# Patient Record
Sex: Female | Born: 1985
Health system: Southern US, Academic
[De-identification: ages and names within clinical notes are randomized; demographics above are authoritative.]

## PROBLEM LIST (undated history)

## (undated) ENCOUNTER — Telehealth

## (undated) ENCOUNTER — Encounter

## (undated) DIAGNOSIS — E669 Obesity, unspecified: Secondary | ICD-10-CM

## (undated) DIAGNOSIS — M1990 Unspecified osteoarthritis, unspecified site: Secondary | ICD-10-CM

## (undated) DIAGNOSIS — G478 Other sleep disorders: Secondary | ICD-10-CM

## (undated) DIAGNOSIS — G43109 Migraine with aura, not intractable, without status migrainosus: Secondary | ICD-10-CM

## (undated) DIAGNOSIS — D649 Anemia, unspecified: Secondary | ICD-10-CM

## (undated) DIAGNOSIS — G43909 Migraine, unspecified, not intractable, without status migrainosus: Secondary | ICD-10-CM

## (undated) HISTORY — DX: Unspecified osteoarthritis, unspecified site: M19.90

## (undated) HISTORY — DX: Migraine, unspecified, not intractable, without status migrainosus: G43.909

## (undated) HISTORY — DX: Anemia, unspecified: D64.9

## (undated) HISTORY — PX: OTHER SURGICAL HISTORY: SHX169

## (undated) HISTORY — DX: Migraine with aura, not intractable, without status migrainosus: G43.109

## (undated) HISTORY — DX: Obesity, unspecified: E66.9

---

## 2014-07-08 ENCOUNTER — Emergency Department
Admission: EM | Admit: 2014-07-08 | Discharge: 2014-07-08 | Disposition: A | Discharge status: Home / Self Care | Payer: Medicaid Other | Attending: Emergency Medicine | Admitting: Emergency Medicine

## 2014-07-08 ENCOUNTER — Encounter: Payer: Self-pay | Admitting: Emergency Medicine

## 2014-07-08 DIAGNOSIS — Z87891 Personal history of nicotine dependence: Secondary | ICD-10-CM | POA: Insufficient documentation

## 2014-07-08 DIAGNOSIS — Z3202 Encounter for pregnancy test, result negative: Secondary | ICD-10-CM | POA: Insufficient documentation

## 2014-07-08 DIAGNOSIS — N390 Urinary tract infection, site not specified: Secondary | ICD-10-CM | POA: Insufficient documentation

## 2014-07-08 DIAGNOSIS — R350 Frequency of micturition: Secondary | ICD-10-CM | POA: Diagnosis present

## 2014-07-08 LAB — URINALYSIS COMPLETE WITH MICROSCOPIC (ARMC ONLY)
Bilirubin Urine: NEGATIVE
Glucose, UA: NEGATIVE mg/dL
Hgb urine dipstick: NEGATIVE
Ketones, ur: NEGATIVE mg/dL
LEUKOCYTES UA: NEGATIVE
NITRITE: NEGATIVE
PH: 5 (ref 5.0–8.0)
Protein, ur: NEGATIVE mg/dL
Specific Gravity, Urine: 1.025 (ref 1.005–1.030)

## 2014-07-08 LAB — POCT PREGNANCY, URINE: PREG TEST UR: NEGATIVE

## 2014-07-08 MED ORDER — SULFAMETHOXAZOLE-TRIMETHOPRIM 800-160 MG PO TABS: 1.0000 | ORAL_TABLET | Freq: Two times a day (BID) | ORAL | Status: DC

## 2014-07-08 MED ORDER — IBUPROFEN 800 MG PO TABS: 800.0000 mg | ORAL_TABLET | Freq: Three times a day (TID) | ORAL | Status: DC | PRN

## 2014-07-08 MED ORDER — PHENAZOPYRIDINE HCL 200 MG PO TABS: 200.0000 mg | ORAL_TABLET | Freq: Three times a day (TID) | ORAL | Status: DC | PRN

## 2014-07-08 NOTE — Discharge Instructions (Signed)

## 2014-07-08 NOTE — ED Notes (Signed)
C/o urinary frequency and burning upon urination, also having some lower back pain

## 2014-07-08 NOTE — ED Notes (Signed)
Pt has left lower back pain.  Pt has urinary frequency.  Pt also reports vag discharge.  No vag bleeding.  No abd pain.  No n/v/d.  Alert.

## 2014-07-08 NOTE — ED Notes (Signed)
AAOx3.  Skin warm and dry.  Verbalizing good understanding of discharge instructions.

## 2014-07-08 NOTE — ED Provider Notes (Signed)
[facility] Emergency Department Provider Note  ____________________________________________  Time seen: Approximately 3:54 PM  I have reviewed the triage vital signs and the nursing notes.   HISTORY  Chief Complaint Urinary Frequency    HPI Nicola Policemanda Breeland is a 29 y.o. female who presents with complaints of having a UTI and lower back pain with urinary frequency. Patient denies any vaginal bleeding or abdominal pain, no nausea vomiting or diarrhea.   No past medical history on file.  There are no active problems to display for this patient.   No past surgical history on file.  Current Outpatient Rx  Name  Route  Sig  Dispense  Refill  . ibuprofen (ADVIL,MOTRIN) 800 MG tablet   Oral   Take 1 tablet (800 mg total) by mouth every 8 (eight) hours as needed.   30 tablet   0   . phenazopyridine (PYRIDIUM) 200 MG tablet   Oral   Take 1 tablet (200 mg total) by mouth 3 (three) times daily as needed for pain.   6 tablet   0   . sulfamethoxazole-trimethoprim (BACTRIM DS,SEPTRA DS) 800-160 MG per tablet   Oral   Take 1 tablet by mouth 2 (two) times daily.   20 tablet   0     Allergies Naproxen and Prednisone  No family history on file.  Social History History  Substance Use Topics  . Smoking status: Former Games developermoker  . Smokeless tobacco: Not on file  . Alcohol Use: No    Review of Systems Constitutional: No fever/chills Eyes: No visual changes. ENT: No sore throat. Cardiovascular: Denies chest pain. Respiratory: Denies shortness of breath. Gastrointestinal: No abdominal pain.  No nausea, no vomiting.  No diarrhea.  No constipation. Genitourinary: Positive for dysuria Musculoskeletal: Mild low back pain. Skin: Negative for rash. Neurological: Negative for headaches, focal weakness or numbness.  10-point ROS otherwise negative.  ____________________________________________   PHYSICAL EXAM:  VITAL SIGNS: ED Triage Vitals  Enc  Vitals Group     BP 07/08/14 1528 115/91 mmHg     Pulse Rate 07/08/14 1528 94     Resp 07/08/14 1528 18     Temp 07/08/14 1528 98.2 F (36.8 C)     Temp Source 07/08/14 1528 Oral     SpO2 07/08/14 1528 98 %     Weight 07/08/14 1528 260 lb (117.935 kg)     Height 07/08/14 1528 5\' 11"  (1.803 m)     Head Cir --      Peak Flow --      Pain Score 07/08/14 1529 7     Pain Loc --      Pain Edu? --      Excl. in GC? --     Constitutional: Alert and oriented. Well appearing and in no acute distress. Eyes: Conjunctivae are normal. PERRL. EOMI. Head: Atraumatic. Nose: No congestion/rhinnorhea. Mouth/Throat: Mucous membranes are moist.  Oropharynx non-erythematous. Neck: No stridor.   Cardiovascular: Normal rate, regular rhythm. Grossly normal heart sounds.  Good peripheral circulation. Respiratory: Normal respiratory effort.  No retractions. Lungs CTAB. Gastrointestinal: Soft and nontender. No distention. No abdominal bruits. No CVA tenderness. Musculoskeletal: No lower extremity tenderness nor edema.  No joint effusions. Neurologic:  Normal speech and language. No gross focal neurologic deficits are appreciated. Speech is normal. No gait instability. Skin:  Skin is warm, dry and intact. No rash noted. Psychiatric: Mood and affect are normal. Speech and behavior are normal.  ____________________________________________   LABS (all labs ordered  are listed, but only abnormal results are displayed)  Labs Reviewed  URINALYSIS COMPLETEWITH MICROSCOPIC ([facility] ONLY) - Abnormal; Notable for the following:    Color, Urine YELLOW (*)    APPearance CLEAR (*)    Bacteria, UA RARE (*)    Squamous Epithelial / LPF 0-5 (*)    All other components within normal limits  POCT PREGNANCY, URINE   ____________________________________________  EKG  Not  applicable ____________________________________________  RADIOLOGY  Deferred ____________________________________________   PROCEDURES  Procedure(s) performed: None  Critical Care performed: No  ____________________________________________   INITIAL IMPRESSION / ASSESSMENT AND PLAN / ED COURSE  Pertinent labs & imaging results that were available during my care of the patient were reviewed by me and considered in my medical decision making (see chart for details).  Acute UTI. Rx given for Bactrim DS twice a day and Pyridium. Ibuprofen given for low back pain. Patient voices no other emergency medical complaints at this time. Will return to the ER with worsening symptomology or follow up with her PCP. ____________________________________________   FINAL CLINICAL IMPRESSION(S) / ED DIAGNOSES  Final diagnoses:  UTI (lower urinary tract infection)      Evangeline Dakin, PA-C 07/08/14 1610  Maurilio Lovely, MD 07/09/14 0030

## 2014-08-26 ENCOUNTER — Ambulatory Visit: Payer: Medicaid Other | Admitting: Family Medicine

## 2014-11-04 ENCOUNTER — Encounter: Payer: Self-pay | Admitting: Family Medicine

## 2014-11-04 ENCOUNTER — Encounter (INDEPENDENT_AMBULATORY_CARE_PROVIDER_SITE_OTHER): Payer: Self-pay

## 2014-11-04 ENCOUNTER — Ambulatory Visit (INDEPENDENT_AMBULATORY_CARE_PROVIDER_SITE_OTHER): Payer: Medicaid Other | Admitting: Family Medicine

## 2014-11-04 VITALS — BP 120/76 | HR 88 | Temp 97.8°F | Resp 16 | Wt 259.5 lb

## 2014-11-04 DIAGNOSIS — Z136 Encounter for screening for cardiovascular disorders: Secondary | ICD-10-CM | POA: Diagnosis not present

## 2014-11-04 DIAGNOSIS — Z1231 Encounter for screening mammogram for malignant neoplasm of breast: Secondary | ICD-10-CM

## 2014-11-04 DIAGNOSIS — G43109 Migraine with aura, not intractable, without status migrainosus: Secondary | ICD-10-CM

## 2014-11-04 DIAGNOSIS — Z13220 Encounter for screening for lipoid disorders: Secondary | ICD-10-CM | POA: Diagnosis not present

## 2014-11-04 DIAGNOSIS — R0681 Apnea, not elsewhere classified: Secondary | ICD-10-CM | POA: Diagnosis not present

## 2014-11-04 DIAGNOSIS — E669 Obesity, unspecified: Secondary | ICD-10-CM | POA: Insufficient documentation

## 2014-11-04 DIAGNOSIS — E66812 Obesity, class 2: Secondary | ICD-10-CM | POA: Insufficient documentation

## 2014-11-04 DIAGNOSIS — N644 Mastodynia: Secondary | ICD-10-CM | POA: Diagnosis not present

## 2014-11-04 DIAGNOSIS — IMO0001 Reserved for inherently not codable concepts without codable children: Secondary | ICD-10-CM

## 2014-11-04 DIAGNOSIS — D509 Iron deficiency anemia, unspecified: Secondary | ICD-10-CM | POA: Diagnosis not present

## 2014-11-04 DIAGNOSIS — R590 Localized enlarged lymph nodes: Secondary | ICD-10-CM | POA: Diagnosis not present

## 2014-11-04 DIAGNOSIS — R5383 Other fatigue: Secondary | ICD-10-CM

## 2014-11-04 HISTORY — DX: Migraine with aura, not intractable, without status migrainosus: G43.109

## 2014-11-04 NOTE — Progress Notes (Signed)
Name: Allison Lucas   MRN: [id number]    DOB: [birth year]   Date:11/04/2014       Progress Note  Subjective  Chief Complaint  Chief Complaint  Patient presents with  . Establish Care  . Anemia  . Migraine  . Referral    sleepy study to r/o nocturnal seizures, was told by Kizzie Bane pcp she might be having some seizures.    HPI  Allison Lucas is a 29 y.o. female here today to transition care of medical needs to a primary care provider. She reports having left [place] with several medical needs not yet worked up or in the process of being worked up. She does not have any records with her today. Symptoms reported today are ongoing issues with sleep. Waking up after nearly choking episodes witnessed by her daughter. She feels fatigued and groggy after each episodes. Daughter has also noticed one episode of eyes rolling to back of head and possible seizure like activity without loss of bowel or bladder. Does not occur every night but most nights. Denies previous history of seizure disorder but does suffer from migraine headaches. Otherwise also has anemia, source unknown but she denies excessive menstrual bleeding or dark tarry stools. She has not been on her iron supplements in months and reports fatigue but no chest pain or dizziness. While in [place] she had breast skin lesion left and right along with axillary masses which has somewhat resolved but she still has some axillary pain. Denies nipple discharge or fevers or redness to the skin of her breasts currently.     Past Medical History  Diagnosis Date  . Anemia   . Osteoarthritis     Patient Active Problem List   Diagnosis Date Noted  . Iron deficiency anemia 11/04/2014  . Witnessed apneic spells 11/04/2014  . Breast tenderness in female 11/04/2014  . Axillary lymphadenopathy 11/04/2014  . Encounter for screening mammogram for malignant neoplasm of breast 11/04/2014  . Obesity, Class II, BMI 35-39.9 11/04/2014  . Migraine without  aura and without status migrainosus, not intractable 11/04/2014  . Other fatigue 11/04/2014    Social History  Substance Use Topics  . Smoking status: Former Games developer  . Smokeless tobacco: Not on file  . Alcohol Use: No     Current outpatient prescriptions:  .  ibuprofen (ADVIL,MOTRIN) 800 MG tablet, Take 1 tablet (800 mg total) by mouth every 8 (eight) hours as needed., Disp: 30 tablet, Rfl: 0 .  nystatin (MYCOSTATIN/NYSTOP) 100000 UNIT/GM POWD, Apply topically., Disp: , Rfl:  .  nystatin cream (MYCOSTATIN), Apply 1 application topically 2 (two) times daily., Disp: , Rfl:  .  Prenatal Vit-Fe Fumarate-FA (PREPLUS) 27-1 MG TABS, Take by mouth., Disp: , Rfl:  .  rizatriptan (MAXALT) 10 MG tablet, Take 10 mg by mouth as needed for migraine. May repeat in 2 hours if needed, Disp: , Rfl:   History reviewed. No pertinent past surgical history.  Family History  Problem Relation Age of Onset  . Arthritis Mother   . Diabetes Mother   . Hyperlipidemia Mother   . Hypertension Mother   . Asthma Daughter     Allergies  Allergen Reactions  . Naproxen Rash  . Prednisone Rash     Review of Systems  CONSTITUTIONAL: No significant weight changes, fever, chills, weakness or fatigue.  HEENT:  - Eyes: No visual changes.  - Ears: No auditory changes. No pain.  - Nose: No sneezing, congestion, runny nose. - Throat: No sore throat. No changes  in swallowing. SKIN: No rash or itching.  CARDIOVASCULAR: No chest pain, chest pressure or chest discomfort. No palpitations or edema.  RESPIRATORY: No shortness of breath, cough or sputum.  GASTROINTESTINAL: No anorexia, nausea, vomiting. No changes in bowel habits. No abdominal pain or blood.  GENITOURINARY: No dysuria. No frequency. No discharge. NEUROLOGICAL: No headache, dizziness, syncope, paralysis, ataxia, numbness or tingling in the extremities. No memory changes. No change in bowel or bladder control.  MUSCULOSKELETAL: No joint pain. No muscle  pain. HEMATOLOGIC: Yes anemia. No excessive bleeding or bruising.  LYMPHATICS: Yes enlarged lymph nodes.  PSYCHIATRIC: No change in mood. Yes change in sleep pattern.  ENDOCRINOLOGIC: No reports of sweating, cold or heat intolerance. No polyuria or polydipsia.     Objective  BP 120/76 mmHg  Pulse 88  Temp(Src) 97.8 F (36.6 C) (Oral)  Resp 16  Wt 259 lb 8 oz (117.708 kg)  SpO2 97%  LMP 10/05/2014 Body mass index is 36.21 kg/(m^2).  Physical Exam  Constitutional: Patient is obese and well-nourished. In no distress. Pale. HEENT:  - Head: Normocephalic and atraumatic.  - Ears: Bilateral TMs gray, no erythema or effusion - Nose: Nasal mucosa moist - Mouth/Throat: Oropharynx is clear and moist. No tonsillar hypertrophy or erythema. No post nasal drainage.  - Eyes: Conjunctivae clear, EOM movements normal. PERRLA. No scleral icterus.  Neck: Normal range of motion. Neck supple. No JVD present. No thyromegaly present.  Cardiovascular: Normal rate, regular rhythm and normal heart sounds.  No murmur heard.  Pulmonary/Chest: Effort normal and breath sounds normal. No respiratory distress. Breast: Bilaterally large pendulous breast tissue with no over masses, skin tugging, skin dimpling, erythema. Some axillary lymphadenopathy noted left greater than right. No nipple discharge.  Musculoskeletal: Normal range of motion bilateral UE and LE, no joint effusions. Peripheral vascular: Bilateral LE no edema. Neurological: CN II-XII grossly intact with no focal deficits. Alert and oriented to person, place, and time. Coordination, balance, strength, speech and gait are normal.  Skin: Skin is warm and dry. No rash noted. No erythema.  Psychiatric: Patient has a normal mood and affect. Behavior is normal in office today. Judgment and thought content normal in office today.   Assessment & Plan  1. Iron deficiency anemia Will reassess with blood work and get her back on oral supplement. If  hemoglobin low or iron saturation low will need Heme evaluation.  - CBC with Differential/Platelet - Comprehensive metabolic panel - Ferritin - Iron - Iron and TIBC  2. Witnessed apneic spells Etiologies include sleep apnea vs night seizures. Will get sleep study first.   - Ambulatory referral to Sleep Studies  3. Breast tenderness in female Clinical exam not very impressive for breast tissue abnormality other than axillary lymphadenopathy but I will have her undergo some breast imaging.   - CBC with Differential/Platelet - MM Digital Screening; Future - MM Digital Diagnostic Bilat; Future - US BREAST LTD UNI LEFT INC AXILLA; Future - US BREAST LTD UNI RIGHT INC AXILLA; Future  4. Axillary lymphadenopathy  - CBC with Differential/Platelet - MM Digital Screening; Future - MM Digital Diagnostic Bilat; Future - US BREAST LTD UNI LEFT INC AXILLA; Future - US BREAST LTD UNI RIGHT INC AXILLA; Future  5. Encounter for screening mammogram for malignant neoplasm of breast  - MM Digital Screening; Future - MM Digital Diagnostic Bilat; Future - US BREAST LTD UNI LEFT INC AXILLA; Future - US BREAST LTD UNI RIGHT INC AXILLA; Future  6. Other fatigue Likely combination of  poor sleep and anemia.  - Comprehensive metabolic panel - TSH  7. Encounter for cholesteral screening for cardiovascular disease  - Lipid panel

## 2014-11-05 ENCOUNTER — Other Ambulatory Visit: Payer: Self-pay | Admitting: Family Medicine

## 2014-11-05 ENCOUNTER — Telehealth: Payer: Self-pay | Admitting: Family Medicine

## 2014-11-05 DIAGNOSIS — R739 Hyperglycemia, unspecified: Secondary | ICD-10-CM

## 2014-11-05 DIAGNOSIS — D509 Iron deficiency anemia, unspecified: Secondary | ICD-10-CM

## 2014-11-05 LAB — COMPREHENSIVE METABOLIC PANEL
ALT: 8 IU/L (ref 0–32)
AST: 11 IU/L (ref 0–40)
Albumin/Globulin Ratio: 1.2 (ref 1.1–2.5)
Albumin: 4.4 g/dL (ref 3.5–5.5)
Alkaline Phosphatase: 98 IU/L (ref 39–117)
BUN/Creatinine Ratio: 14 (ref 8–20)
BUN: 10 mg/dL (ref 6–20)
Bilirubin Total: <0.2 mg/dL (ref 0.0–1.2)
CALCIUM: 9.5 mg/dL (ref 8.7–10.2)
CO2: 25 mmol/L (ref 18–29)
CREATININE: 0.74 mg/dL (ref 0.57–1.00)
Chloride: 101 mmol/L (ref 97–108)
GFR calc Af Amer: 126 mL/min/{1.73_m2} (ref >59)
GFR, EST NON AFRICAN AMERICAN: 109 mL/min/{1.73_m2} (ref >59)
GLOBULIN, TOTAL: 3.7 g/dL (ref 1.5–4.5)
Glucose: 125 mg/dL — ABNORMAL HIGH (ref 65–99)
Potassium: 4.2 mmol/L (ref 3.5–5.2)
SODIUM: 142 mmol/L (ref 134–144)
Total Protein: 8.1 g/dL (ref 6.0–8.5)

## 2014-11-05 LAB — IRON AND TIBC
Iron Saturation: 6 % — CL (ref 15–55)
Iron: 25 ug/dL — ABNORMAL LOW (ref 27–159)
Total Iron Binding Capacity: 438 ug/dL (ref 250–450)
UIBC: 413 ug/dL (ref 131–425)

## 2014-11-05 LAB — CBC WITH DIFFERENTIAL/PLATELET
Basophils Absolute: 0 10*3/uL (ref 0.0–0.2)
Basos: 0 %
EOS (ABSOLUTE): 0 10*3/uL (ref 0.0–0.4)
EOS: 0 %
HEMATOCRIT: 33.6 % — AB (ref 34.0–46.6)
Hemoglobin: 10.5 g/dL — ABNORMAL LOW (ref 11.1–15.9)
IMMATURE GRANS (ABS): 0 10*3/uL (ref 0.0–0.1)
IMMATURE GRANULOCYTES: 0 %
LYMPHS: 27 %
Lymphocytes Absolute: 2.3 10*3/uL (ref 0.7–3.1)
MCH: 19.6 pg — ABNORMAL LOW (ref 26.6–33.0)
MCHC: 31.3 g/dL — ABNORMAL LOW (ref 31.5–35.7)
MCV: 63 fL — ABNORMAL LOW (ref 79–97)
MONOCYTES: 6 %
MONOS ABS: 0.5 10*3/uL (ref 0.1–0.9)
NEUTROS PCT: 67 %
Neutrophils Absolute: 5.6 10*3/uL (ref 1.4–7.0)
Platelets: 348 10*3/uL (ref 150–379)
RBC: 5.37 x10E6/uL — AB (ref 3.77–5.28)
RDW: 18.7 % — ABNORMAL HIGH (ref 12.3–15.4)
WBC: 8.5 10*3/uL (ref 3.4–10.8)

## 2014-11-05 LAB — LIPID PANEL
CHOL/HDL RATIO: 3.1 ratio (ref 0.0–4.4)
Cholesterol, Total: 197 mg/dL (ref 100–199)
HDL: 64 mg/dL (ref >39)
LDL CALC: 119 mg/dL — AB (ref 0–99)
Triglycerides: 68 mg/dL (ref 0–149)
VLDL Cholesterol Cal: 14 mg/dL (ref 5–40)

## 2014-11-05 LAB — TSH: TSH: 2.83 u[IU]/mL (ref 0.450–4.500)

## 2014-11-05 LAB — FERRITIN: Ferritin: 15 ng/mL (ref 15–150)

## 2014-11-05 MED ORDER — FERROUS SULFATE 325 (65 FE) MG PO TABS: 325.0000 mg | ORAL_TABLET | Freq: Two times a day (BID) | ORAL | Status: DC

## 2014-11-05 NOTE — Telephone Encounter (Signed)
Contacted this patient to review Dr. Debby Freiberg message and to see if she wanted to proceed with the hematologist referral. Patient was informed and agreed to proceed with referral . Patient then asked if she was still going to call in the rx iron b/c the pharmacy did not have it yesterday. I told her that I would check.  Spoke to Griffin with [facility] @ 9:14am to add on test #[id number] (HgA1c). Fax will be sent to Dr. Sherley Bounds authorizing this add on.

## 2014-11-05 NOTE — Telephone Encounter (Signed)
Sent iron Rx to [facility] and referral to heme placed.

## 2014-11-05 NOTE — Telephone Encounter (Signed)
Please let Allison Lucas know that I have reviewed some of her recent lab work and she is quite severely iron deficient anemia and I want her to consult with hematology specialist as she may need IV iron therapy to boost her levels up and oral iron will not be enough initially. Can I place referral? Sometimes low iron can cause leg cramping, shaking, this may explain some of her symptoms at night but I still want her to do the sleep study too which I have already ordered.  Allison Lucas: call [facility] and add on HBA1C as her glucose was high.

## 2014-11-06 ENCOUNTER — Encounter: Payer: Self-pay | Admitting: Family Medicine

## 2014-11-06 DIAGNOSIS — R739 Hyperglycemia, unspecified: Secondary | ICD-10-CM | POA: Insufficient documentation

## 2014-11-06 LAB — HGB A1C W/O EAG: Hgb A1c MFr Bld: 6.3 % — ABNORMAL HIGH (ref 4.8–5.6)

## 2014-11-06 LAB — SPECIMEN STATUS REPORT

## 2014-11-21 ENCOUNTER — Inpatient Hospital Stay: Payer: Medicaid Other | Attending: Oncology | Admitting: Oncology

## 2014-11-21 VITALS — BP 119/78 | HR 75 | Temp 96.4°F | Resp 20 | Wt 262.8 lb

## 2014-11-21 DIAGNOSIS — D509 Iron deficiency anemia, unspecified: Secondary | ICD-10-CM | POA: Diagnosis present

## 2014-11-21 DIAGNOSIS — R531 Weakness: Secondary | ICD-10-CM | POA: Diagnosis not present

## 2014-11-21 DIAGNOSIS — R5383 Other fatigue: Secondary | ICD-10-CM | POA: Insufficient documentation

## 2014-11-21 DIAGNOSIS — M1990 Unspecified osteoarthritis, unspecified site: Secondary | ICD-10-CM | POA: Diagnosis not present

## 2014-11-21 DIAGNOSIS — Z87891 Personal history of nicotine dependence: Secondary | ICD-10-CM | POA: Insufficient documentation

## 2014-11-21 DIAGNOSIS — Z79899 Other long term (current) drug therapy: Secondary | ICD-10-CM | POA: Diagnosis not present

## 2014-11-21 NOTE — Progress Notes (Signed)
Patient here today for initial evaluation regarding anemia. Patient reports weakness and fatigue, currently taking oral iron twice a day.

## 2014-11-28 ENCOUNTER — Ambulatory Visit: Payer: Medicaid Other

## 2014-12-05 ENCOUNTER — Ambulatory Visit: Payer: Medicaid Other

## 2014-12-05 NOTE — Progress Notes (Signed)
[facility]  Telephone:([phone] [phone]  ID: Allison Lucas OB: 04/02/1985  MR#: [id number]  AOZ#:[id number]  Patient Care Team: Edwena Felty, MD as PCP - General (Family Medicine)  CHIEF COMPLAINT:  Chief Complaint  Patient presents with  . Anemia    new evaluation    INTERVAL HISTORY: Patient is a 29 year old female who was found to have a decreased hemoglobin and iron stores and routine evaluation. She currently feels weak and fatigued, but otherwise feels well. She is no neurologic complaints. She denies any recent fevers or illnesses. She has a good appetite and denies weight loss. She has no chest pain or shortness of breath. She denies any nausea, vomiting, constipation, or diarrhea. She has no melena or hematochezia. She has no urinary complaints. Patient otherwise feels well and offers no further specific complaints.  REVIEW OF SYSTEMS:   Review of Systems  Constitutional: Positive for malaise/fatigue. Negative for fever and weight loss.  Respiratory: Negative.   Cardiovascular: Negative.   Gastrointestinal: Negative.  Negative for blood in stool and melena.  Musculoskeletal: Negative.   Neurological: Positive for weakness.    As per HPI. Otherwise, a complete review of systems is negatve.  PAST MEDICAL HISTORY: Past Medical History  Diagnosis Date  . Anemia   . Osteoarthritis     PAST SURGICAL HISTORY: No past surgical history on file.  FAMILY HISTORY Family History  Problem Relation Age of Onset  . Arthritis Mother   . Diabetes Mother   . Hyperlipidemia Mother   . Hypertension Mother   . Asthma Daughter        ADVANCED DIRECTIVES:    HEALTH MAINTENANCE: Social History  Substance Use Topics  . Smoking status: Former Games developer  . Smokeless tobacco: Not on file  . Alcohol Use: No     Colonoscopy:  PAP:  Bone density:  Lipid panel:  Allergies  Allergen Reactions  . Naproxen Rash  . Prednisone Rash     Current Outpatient Prescriptions  Medication Sig Dispense Refill  . ferrous sulfate 325 (65 FE) MG tablet Take 1 tablet (325 mg total) by mouth 2 (two) times daily with a meal. 60 tablet 2  . ibuprofen (ADVIL,MOTRIN) 800 MG tablet Take 1 tablet (800 mg total) by mouth every 8 (eight) hours as needed. 30 tablet 0  . nystatin (MYCOSTATIN/NYSTOP) 100000 UNIT/GM POWD Apply topically.    . nystatin cream (MYCOSTATIN) Apply 1 application topically 2 (two) times daily.    . rizatriptan (MAXALT) 10 MG tablet Take 10 mg by mouth as needed for migraine. May repeat in 2 hours if needed    . Prenatal Vit-Fe Fumarate-FA (PREPLUS) 27-1 MG TABS Take by mouth.     No current facility-administered medications for this visit.    OBJECTIVE: Filed Vitals:   11/21/14 1206  BP: 119/78  Pulse: 75  Temp: 96.4 F (35.8 C)  Resp: 20     Body mass index is 36.67 kg/(m^2).    ECOG FS:0 - Asymptomatic  General: Well-developed, well-nourished, no acute distress. Eyes: Pink conjunctiva, anicteric sclera. HEENT: Normocephalic, moist mucous membranes, clear oropharnyx. Lungs: Clear to auscultation bilaterally. Heart: Regular rate and rhythm. No rubs, murmurs, or gallops. Abdomen: Soft, nontender, nondistended. No organomegaly noted, normoactive bowel sounds. Musculoskeletal: No edema, cyanosis, or clubbing. Neuro: Alert, answering all questions appropriately. Cranial nerves grossly intact. Skin: No rashes or petechiae noted. Psych: Normal affect. Lymphatics: No cervical, calvicular, axillary or inguinal LAD.   LAB RESULTS:  Lab Results  Component  Value Date   NA 142 11/04/2014   K 4.2 11/04/2014   CL 101 11/04/2014   CO2 25 11/04/2014   GLUCOSE 125* 11/04/2014   BUN 10 11/04/2014   CREATININE 0.74 11/04/2014   CALCIUM 9.5 11/04/2014   PROT 8.1 11/04/2014   ALBUMIN 4.4 11/04/2014   AST 11 11/04/2014   ALT 8 11/04/2014   ALKPHOS 98 11/04/2014   BILITOT <0.2 11/04/2014   GFRNONAA 109  11/04/2014   GFRAA 126 11/04/2014    Lab Results  Component Value Date   WBC 8.5 11/04/2014   NEUTROABS 5.6 11/04/2014   HCT 33.6* 11/04/2014     STUDIES: No results found.  ASSESSMENT: Iron deficiency anemia.  PLAN:    1. Iron deficiency anemia: Patient's hemoglobin and iron stores are significantly reduced and she will benefit from IV iron. Return to clinic in 1 and 2 weeks for 510 mg IV Feraheme. Patient will then return to clinic in 3 months with repeat laboratory work and further evaluation. Will complete anemia workup including hemoglobinopathy profile with her next lab draw. She has been instructed to continue her oral iron supplementation.  Patient expressed understanding and was in agreement with this plan. She also understands that She can call clinic at any time with any questions, concerns, or complaints.    Jeralyn Ruthsimothy J Finnegan, MD   12/05/2014 3:09 PM

## 2015-03-03 ENCOUNTER — Inpatient Hospital Stay: Payer: Medicaid Other | Admitting: Oncology

## 2015-03-03 ENCOUNTER — Inpatient Hospital Stay: Payer: Medicaid Other

## 2015-03-10 ENCOUNTER — Inpatient Hospital Stay: Payer: Medicaid Other

## 2015-03-10 ENCOUNTER — Inpatient Hospital Stay: Payer: Medicaid Other | Admitting: Oncology

## 2015-03-12 ENCOUNTER — Other Ambulatory Visit: Payer: Self-pay | Admitting: Family Medicine

## 2015-03-25 ENCOUNTER — Ambulatory Visit: Payer: Medicaid Other | Admitting: Oncology

## 2015-03-25 ENCOUNTER — Other Ambulatory Visit: Payer: Medicaid Other

## 2015-03-25 ENCOUNTER — Ambulatory Visit: Payer: Medicaid Other

## 2015-07-22 ENCOUNTER — Encounter: Payer: Self-pay | Admitting: Family Medicine

## 2015-07-22 ENCOUNTER — Ambulatory Visit (INDEPENDENT_AMBULATORY_CARE_PROVIDER_SITE_OTHER): Payer: Medicaid Other | Admitting: Family Medicine

## 2015-07-22 ENCOUNTER — Emergency Department: Payer: Medicaid Other

## 2015-07-22 ENCOUNTER — Other Ambulatory Visit: Payer: Self-pay | Admitting: Family Medicine

## 2015-07-22 ENCOUNTER — Other Ambulatory Visit: Payer: Self-pay

## 2015-07-22 ENCOUNTER — Emergency Department
Admission: EM | Admit: 2015-07-22 | Discharge: 2015-07-22 | Disposition: A | Discharge status: Home / Self Care | Payer: Medicaid Other | Attending: Emergency Medicine | Admitting: Emergency Medicine

## 2015-07-22 ENCOUNTER — Emergency Department: Admission: EM | Admit: 2015-07-22 | Discharge status: Absent without leave | Payer: Medicaid Other

## 2015-07-22 VITALS — BP 120/92 | HR 102 | Temp 98.2°F | Resp 16 | Wt 269.0 lb

## 2015-07-22 DIAGNOSIS — D509 Iron deficiency anemia, unspecified: Secondary | ICD-10-CM

## 2015-07-22 DIAGNOSIS — Z114 Encounter for screening for human immunodeficiency virus [HIV]: Secondary | ICD-10-CM

## 2015-07-22 DIAGNOSIS — R002 Palpitations: Secondary | ICD-10-CM | POA: Diagnosis not present

## 2015-07-22 DIAGNOSIS — R195 Other fecal abnormalities: Secondary | ICD-10-CM | POA: Insufficient documentation

## 2015-07-22 DIAGNOSIS — R0789 Other chest pain: Secondary | ICD-10-CM | POA: Insufficient documentation

## 2015-07-22 DIAGNOSIS — R590 Localized enlarged lymph nodes: Secondary | ICD-10-CM | POA: Diagnosis not present

## 2015-07-22 DIAGNOSIS — Z87891 Personal history of nicotine dependence: Secondary | ICD-10-CM | POA: Insufficient documentation

## 2015-07-22 DIAGNOSIS — E669 Obesity, unspecified: Secondary | ICD-10-CM

## 2015-07-22 DIAGNOSIS — R9431 Abnormal electrocardiogram [ECG] [EKG]: Secondary | ICD-10-CM | POA: Insufficient documentation

## 2015-07-22 DIAGNOSIS — R12 Heartburn: Secondary | ICD-10-CM | POA: Insufficient documentation

## 2015-07-22 DIAGNOSIS — R739 Hyperglycemia, unspecified: Secondary | ICD-10-CM

## 2015-07-22 DIAGNOSIS — N644 Mastodynia: Secondary | ICD-10-CM | POA: Diagnosis not present

## 2015-07-22 DIAGNOSIS — R0781 Pleurodynia: Secondary | ICD-10-CM | POA: Diagnosis not present

## 2015-07-22 DIAGNOSIS — Z1231 Encounter for screening mammogram for malignant neoplasm of breast: Secondary | ICD-10-CM

## 2015-07-22 DIAGNOSIS — M1990 Unspecified osteoarthritis, unspecified site: Secondary | ICD-10-CM | POA: Insufficient documentation

## 2015-07-22 DIAGNOSIS — R000 Tachycardia, unspecified: Secondary | ICD-10-CM

## 2015-07-22 HISTORY — DX: Obesity, unspecified: E66.9

## 2015-07-22 LAB — CBC WITH DIFFERENTIAL/PLATELET
BASOS ABS: 0 {cells}/uL (ref 0–200)
BASOS PCT: 0 %
EOS PCT: 0 %
Eosinophils Absolute: 0 cells/uL — ABNORMAL LOW (ref 15–500)
HCT: 38 % (ref 35.0–45.0)
HEMOGLOBIN: 11.7 g/dL (ref 11.7–15.5)
LYMPHS ABS: 2366 {cells}/uL (ref 850–3900)
Lymphocytes Relative: 26 %
MCH: 20.9 pg — AB (ref 27.0–33.0)
MCHC: 30.8 g/dL — ABNORMAL LOW (ref 32.0–36.0)
MCV: 68 fL — ABNORMAL LOW (ref 80.0–100.0)
Monocytes Absolute: 455 cells/uL (ref 200–950)
Monocytes Relative: 5 %
NEUTROS ABS: 6279 {cells}/uL (ref 1500–7800)
Neutrophils Relative %: 69 %
Platelets: 352 10*3/uL (ref 140–400)
RBC: 5.59 MIL/uL — ABNORMAL HIGH (ref 3.80–5.10)
RDW: 18 % — ABNORMAL HIGH (ref 11.0–15.0)
WBC: 9.1 10*3/uL (ref 3.8–10.8)

## 2015-07-22 LAB — CBC
HCT: 37.2 % (ref 35.0–47.0)
Hemoglobin: 11.6 g/dL — ABNORMAL LOW (ref 12.0–16.0)
MCH: 21.2 pg — ABNORMAL LOW (ref 26.0–34.0)
MCHC: 31.3 g/dL — ABNORMAL LOW (ref 32.0–36.0)
MCV: 67.7 fL — ABNORMAL LOW (ref 80.0–100.0)
Platelets: 345 10*3/uL (ref 150–440)
RBC: 5.49 MIL/uL — ABNORMAL HIGH (ref 3.80–5.20)
RDW: 17.6 % — ABNORMAL HIGH (ref 11.5–14.5)
WBC: 11.8 10*3/uL — ABNORMAL HIGH (ref 3.6–11.0)

## 2015-07-22 LAB — BASIC METABOLIC PANEL WITH GFR
Anion gap: 8 (ref 5–15)
BUN: 12 mg/dL (ref 6–20)
CO2: 26 mmol/L (ref 22–32)
Calcium: 9.3 mg/dL (ref 8.9–10.3)
Chloride: 103 mmol/L (ref 101–111)
Creatinine, Ser: 0.9 mg/dL (ref 0.44–1.00)
GFR calc Af Amer: >60 mL/min
GFR calc non Af Amer: >60 mL/min
Glucose, Bld: 93 mg/dL (ref 65–99)
Potassium: 3.5 mmol/L (ref 3.5–5.1)
Sodium: 137 mmol/L (ref 135–145)

## 2015-07-22 LAB — FERRITIN: Ferritin: 25 ng/mL (ref 10–154)

## 2015-07-22 LAB — TSH: TSH: 1.39 mIU/L

## 2015-07-22 LAB — HEMOGLOBIN A1C
HEMOGLOBIN A1C: 6.2 % — AB (ref <5.7)
MEAN PLASMA GLUCOSE: 131 mg/dL

## 2015-07-22 LAB — TROPONIN I
Troponin I: 0.05 ng/mL — ABNORMAL HIGH
Troponin I: <0.03 ng/mL

## 2015-07-22 LAB — FIBRIN DERIVATIVES D-DIMER (ARMC ONLY): Fibrin derivatives D-dimer (ARMC): 363 (ref 0–499)

## 2015-07-22 MED ORDER — GI COCKTAIL ~~LOC~~
30.0000 mL | ORAL | Status: AC
  Administered 2015-07-22: 30 mL via ORAL
  Filled 2015-07-22: qty 30

## 2015-07-22 MED ORDER — FAMOTIDINE 20 MG PO TABS
40.0000 mg | ORAL_TABLET | Freq: Once | ORAL | Status: AC
  Administered 2015-07-22: 40 mg via ORAL
  Filled 2015-07-22: qty 2

## 2015-07-22 MED ORDER — FAMOTIDINE 20 MG PO TABS: 20.0000 mg | ORAL_TABLET | Freq: Two times a day (BID) | ORAL | Status: DC

## 2015-07-22 NOTE — Discharge Instructions (Signed)
Nonspecific Chest Pain  °Chest pain can be caused by many different conditions. There is always a chance that your pain could be related to something serious, such as a heart attack or a blood clot in your lungs. Chest pain can also be caused by conditions that are not life-threatening. If you have chest pain, it is very important to follow up with your health care provider. °CAUSES  °Chest pain can be caused by: °· Heartburn. °· Pneumonia or bronchitis. °· Anxiety or stress. °· Inflammation around your heart (pericarditis) or lung (pleuritis or pleurisy). °· A blood clot in your lung. °· A collapsed lung (pneumothorax). It can develop suddenly on its own (spontaneous pneumothorax) or from trauma to the chest. °· Shingles infection (varicella-zoster virus). °· Heart attack. °· Damage to the bones, muscles, and cartilage that make up your chest wall. This can include: °¨ Bruised bones due to injury. °¨ Strained muscles or cartilage due to frequent or repeated coughing or overwork. °¨ Fracture to one or more ribs. °¨ Sore cartilage due to inflammation (costochondritis). °RISK FACTORS  °Risk factors for chest pain may include: °· Activities that increase your risk for trauma or injury to your chest. °· Respiratory infections or conditions that cause frequent coughing. °· Medical conditions or overeating that can cause heartburn. °· Heart disease or family history of heart disease. °· Conditions or health behaviors that increase your risk of developing a blood clot. °· Having had chicken pox (varicella zoster). °SIGNS AND SYMPTOMS °Chest pain can feel like: °· Burning or tingling on the surface of your chest or deep in your chest. °· Crushing, pressure, aching, or squeezing pain. °· Dull or sharp pain that is worse when you move, cough, or take a deep breath. °· Pain that is also felt in your back, neck, shoulder, or arm, or pain that spreads to any of these areas. °Your chest pain may come and go, or it may stay  constant. °DIAGNOSIS °Lab tests or other studies may be needed to find the cause of your pain. Your health care provider may have you take a test called an ambulatory ECG (electrocardiogram). An ECG records your heartbeat patterns at the time the test is performed. You may also have other tests, such as: °· Transthoracic echocardiogram (TTE). During echocardiography, sound waves are used to create a picture of all of the heart structures and to look at how blood flows through your heart. °· Transesophageal echocardiogram (TEE). This is a more advanced imaging test that obtains images from inside your body. It allows your health care provider to see your heart in finer detail. °· Cardiac monitoring. This allows your health care provider to monitor your heart rate and rhythm in real time. °· Holter monitor. This is a portable device that records your heartbeat and can help to diagnose abnormal heartbeats. It allows your health care provider to track your heart activity for several days, if needed. °· Stress tests. These can be done through exercise or by taking medicine that makes your heart beat more quickly. °· Blood tests. °· Imaging tests. °TREATMENT  °Your treatment depends on what is causing your chest pain. Treatment may include: °· Medicines. These may include: °¨ Acid blockers for heartburn. °¨ Anti-inflammatory medicine. °¨ Pain medicine for inflammatory conditions. °¨ Antibiotic medicine, if an infection is present. °¨ Medicines to dissolve blood clots. °¨ Medicines to treat coronary artery disease. °· Supportive care for conditions that do not require medicines. This may include: °¨ Resting. °¨ Applying heat   or cold packs to injured areas. °¨ Limiting activities until pain decreases. °HOME CARE INSTRUCTIONS °· If you were prescribed an antibiotic medicine, finish it all even if you start to feel better. °· Avoid any activities that bring on chest pain. °· Do not use any tobacco products, including  cigarettes, chewing tobacco, or electronic cigarettes. If you need help quitting, ask your health care provider. °· Do not drink alcohol. °· Take medicines only as directed by your health care provider. °· Keep all follow-up visits as directed by your health care provider. This is important. This includes any further testing if your chest pain does not go away. °· If heartburn is the cause for your chest pain, you may be told to keep your head raised (elevated) while sleeping. This reduces the chance that acid will go from your stomach into your esophagus. °· Make lifestyle changes as directed by your health care provider. These may include: °¨ Getting regular exercise. Ask your health care provider to suggest some activities that are safe for you. °¨ Eating a heart-healthy diet. A registered dietitian can help you to learn healthy eating options. °¨ Maintaining a healthy weight. °¨ Managing diabetes, if necessary. °¨ Reducing stress. °SEEK MEDICAL CARE IF: °· Your chest pain does not go away after treatment. °· You have a rash with blisters on your chest. °· You have a fever. °SEEK IMMEDIATE MEDICAL CARE IF:  °· Your chest pain is worse. °· You have an increasing cough, or you cough up blood. °· You have severe abdominal pain. °· You have severe weakness. °· You faint. °· You have chills. °· You have sudden, unexplained chest discomfort. °· You have sudden, unexplained discomfort in your arms, back, neck, or jaw. °· You have shortness of breath at any time. °· You suddenly start to sweat, or your skin gets clammy. °· You feel nauseous or you vomit. °· You suddenly feel light-headed or dizzy. °· Your heart begins to beat quickly, or it feels like it is skipping beats. °These symptoms may represent a serious problem that is an emergency. Do not wait to see if the symptoms will go away. Get medical help right away. Call your local emergency services (911 in the [place] Do not drive yourself to the hospital. °  °This  information is not intended to replace advice given to you by your health care provider. Make sure you discuss any questions you have with your health care provider. °  °Document Released: 10/28/2004 Document Revised: 02/08/2014 Document Reviewed: 08/24/2013 °Elsevier Interactive Patient Education ©2016 [employer] °Heartburn °Heartburn is a type of pain or discomfort that can happen in the throat or chest. It is often described as a burning pain. It may also cause a bad taste in the mouth. Heartburn may feel worse when you lie down or bend over, and it is often worse at night. Heartburn may be caused by stomach contents that move back up into the esophagus (reflux). °HOME CARE INSTRUCTIONS °Take these actions to decrease your discomfort and to help avoid complications. °Diet °· Follow a diet as recommended by your health care provider. This may involve avoiding foods and drinks such as: °¨ Coffee and tea (with or without caffeine). °¨ Drinks that contain alcohol. °¨ Energy drinks and sports drinks. °¨ Carbonated drinks or sodas. °¨ Chocolate and cocoa. °¨ Peppermint and mint flavorings. °¨ Garlic and onions. °¨ Horseradish. °¨ Spicy and acidic foods, including peppers, chili powder, curry powder, vinegar, hot sauces, and barbecue sauce. °¨   Citrus fruit juices and citrus fruits, such as oranges, lemons, and limes. °¨ Tomato-based foods, such as red sauce, chili, salsa, and pizza with red sauce. °¨ Fried and fatty foods, such as donuts, french fries, potato chips, and high-fat dressings. °¨ High-fat meats, such as hot dogs and fatty cuts of red and white meats, such as rib eye steak, sausage, ham, and bacon. °¨ High-fat dairy items, such as whole milk, butter, and cream cheese. °· Eat small, frequent meals instead of large meals. °· Avoid drinking large amounts of liquid with your meals. °· Avoid eating meals during the 2-3 hours before bedtime. °· Avoid lying down right after you eat. °· Do not exercise right  after you eat. ° General Instructions  °· Pay attention to any changes in your symptoms. °· Take over-the-counter and prescription medicines only as told by your health care provider. Do not take aspirin, ibuprofen, or other NSAIDs unless your health care provider told you to do so. °· Do not use any tobacco products, including cigarettes, chewing tobacco, and e-cigarettes. If you need help quitting, ask your health care provider. °· Wear loose-fitting clothing. Do not wear anything tight around your waist that causes pressure on your abdomen. °· Raise (elevate) the head of your bed about 6 inches (15 cm). °· Try to reduce your stress, such as with yoga or meditation. If you need help reducing stress, ask your health care provider. °· If you are overweight, reduce your weight to an amount that is healthy for you. Ask your health care provider for guidance about a safe weight loss goal. °· Keep all follow-up visits as told by your health care provider. This is important. °SEEK MEDICAL CARE IF: °· You have new symptoms. °· You have unexplained weight loss. °· You have difficulty swallowing, or it hurts to swallow. °· You have wheezing or a persistent cough. °· Your symptoms do not improve with treatment. °· You have frequent heartburn for more than two weeks. °SEEK IMMEDIATE MEDICAL CARE IF: °· You have pain in your arms, neck, jaw, teeth, or back. °· You feel sweaty, dizzy, or light-headed. °· You have chest pain or shortness of breath. °· You vomit and your vomit looks like blood or coffee grounds. °· Your stool is bloody or black. °  °This information is not intended to replace advice given to you by your health care provider. Make sure you discuss any questions you have with your health care provider. °  °Document Released: 06/06/2008 Document Revised: 10/09/2014 Document Reviewed: 05/15/2014 °Elsevier Interactive Patient Education ©2016 Elsevier Inc. ° °

## 2015-07-22 NOTE — Progress Notes (Signed)
BP 120/92 mmHg  Pulse 102  Temp(Src) 98.2 F (36.8 C) (Oral)  Resp 16  Wt 269 lb (122.018 kg)  SpO2 95%  LMP 07/19/2015   Subjective:    Patient ID: Allison Lucas, female    DOB: 31-Oct-1985, 30 y.o.   MRN: [id number]  HPI: Allison Lucas is a 30 y.o. female  Chief Complaint  Patient presents with  . Medication Refill  . headaches    with blurred vision, light sensitivity and nausea  . Back Pain    joint pain    She is having some burning in the chest in between the breasts; no acid reflux; like pins sticking; no bites; heart has been racing; not sure if anxiety attacks; heart beating so fast that it feels like it is going to pop out of her chest; lays down; no change in stress level; no change in diet; no change in the caffeine; not many sodas; sometimes short of breath; no known family hx of anxiety or panic attacks; no change in weight, maybe increase;   Dr. Orlie DakinFinnegan has her taking iron pills; she has iron deficiency anemia; it upsets her stomach; she notices mucous in her stools; going on for months; sometimes has abd pain; no blood in the stool; she has not been back, note October 2016 reviewed;   [facility]2015 11/04/2014  Decreased Interest 0 0  Down, Depressed, Hopeless 0 0  PHQ - 2 Score 0 0    No flowsheet data found.  Relevant past medical, surgical, family and social history reviewed Past Medical History  Diagnosis Date  . Anemia   . Osteoarthritis    History reviewed. No pertinent past surgical history. Family History  Problem Relation Age of Onset  . Arthritis Mother   . Diabetes Mother   . Hyperlipidemia Mother   . Hypertension Mother   . Asthma Daughter    Social History  Substance Use Topics  . Smoking status: Former Games developermoker  . Smokeless tobacco: None  . Alcohol Use: No    Interim medical history since last visit reviewed. Allergies and medications reviewed  Review of Systems Per HPI unless specifically indicated  above     Objective:    BP 120/92 mmHg  Pulse 102  Temp(Src) 98.2 F (36.8 C) (Oral)  Resp 16  Wt 269 lb (122.018 kg)  SpO2 95%  LMP 07/19/2015  Wt Readings from Last 3 Encounters:  07/22/15 269 lb (122.018 kg)  11/21/14 262 lb 12.6 oz (119.2 kg)  11/04/14 259 lb 8 oz (117.708 kg)   body mass index is 37.53 kg/(m^2).  Physical Exam  Results for orders placed or performed in visit on 11/04/14  CBC with Differential/Platelet  Result Value Ref Range   WBC 8.5 3.4 - 10.8 x10E3/uL   RBC 5.37 (H) 3.77 - 5.28 x10E6/uL   Hemoglobin 10.5 (L) 11.1 - 15.9 g/dL   Hematocrit 96.233.6 (L) 95.234.0 - 46.6 %   MCV 63 (L) 79 - 97 fL   MCH 19.6 (L) 26.6 - 33.0 pg   MCHC 31.3 (L) 31.5 - 35.7 g/dL   RDW 84.118.7 (H) 32.412.3 - 40.115.4 %   Platelets 348 150 - 379 x10E3/uL   Neutrophils 67 %   Lymphs 27 %   Monocytes 6 %   Eos 0 %   Basos 0 %   Neutrophils Absolute 5.6 1.4 - 7.0 x10E3/uL   Lymphocytes Absolute 2.3 0.7 - 3.1 x10E3/uL   Monocytes Absolute 0.5 0.1 - 0.9  x10E3/uL   EOS (ABSOLUTE) 0.0 0.0 - 0.4 x10E3/uL   Basophils Absolute 0.0 0.0 - 0.2 x10E3/uL   Immature Granulocytes 0 %   Immature Grans (Abs) 0.0 0.0 - 0.1 x10E3/uL  Comprehensive metabolic panel  Result Value Ref Range   Glucose 125 (H) 65 - 99 mg/dL   BUN 10 6 - 20 mg/dL   Creatinine, Ser 0.96 0.57 - 1.00 mg/dL   GFR calc non Af Amer 109 >59 mL/min/1.73   GFR calc Af Amer 126 >59 mL/min/1.73   BUN/Creatinine Ratio 14 8 - 20   Sodium 142 134 - 144 mmol/L   Potassium 4.2 3.5 - 5.2 mmol/L   Chloride 101 97 - 108 mmol/L   CO2 25 18 - 29 mmol/L   Calcium 9.5 8.7 - 10.2 mg/dL   Total Protein 8.1 6.0 - 8.5 g/dL   Albumin 4.4 3.5 - 5.5 g/dL   Globulin, Total 3.7 1.5 - 4.5 g/dL   Albumin/Globulin Ratio 1.2 1.1 - 2.5   Bilirubin Total <0.2 0.0 - 1.2 mg/dL   Alkaline Phosphatase 98 39 - 117 IU/L   AST 11 0 - 40 IU/L   ALT 8 0 - 32 IU/L  Lipid panel  Result Value Ref Range   Cholesterol, Total 197 100 - 199 mg/dL   Triglycerides 68 0  - 149 mg/dL   HDL 64 >04 mg/dL   VLDL Cholesterol Cal 14 5 - 40 mg/dL   LDL Calculated 540 (H) 0 - 99 mg/dL   Chol/HDL Ratio 3.1 0.0 - 4.4 ratio units  TSH  Result Value Ref Range   TSH 2.830 0.450 - 4.500 uIU/mL  Ferritin  Result Value Ref Range   Ferritin 15 15 - 150 ng/mL  Iron and TIBC  Result Value Ref Range   Total Iron Binding Capacity 438 250 - 450 ug/dL   UIBC 981 191 - 478 ug/dL   Iron 25 (L) 27 - 295 ug/dL   Iron Saturation 6 (LL) 15 - 55 %  Hgb A1c w/o eAG  Result Value Ref Range   Hgb A1c MFr Bld 6.3 (H) 4.8 - 5.6 %  Specimen status report  Result Value Ref Range   specimen status report Comment       Assessment & Plan:   Problem List Items Addressed This Visit    None       Follow up plan: No Follow-up on file.  An after-visit summary was printed and given to the patient at check-out.  Please see the patient instructions which may contain other information and recommendations beyond what is mentioned above in the assessment and plan.  No orders of the defined types were placed in this encounter.    No orders of the defined types were placed in this encounter.

## 2015-07-22 NOTE — ED Provider Notes (Signed)
[facility] Emergency Department Provider Note  ____________________________________________  Time seen: 5:50 PM  I have reviewed the triage vital signs and the nursing notes.   HISTORY  Chief Complaint Shortness of Breath    HPI Allison Lucas is a 30 y.o. female sent to the ED by her primary care doctor for evaluation for possible pulmonary embolism. Patient reports that over the past week she has had intermittent chest discomfort described as burning. It is in the center of the chest radiating up and down between the epigastrium and central the chest behind the sternum. Not exertional, not pleuritic. Not radiating to any other area. No associated shortness of breath diaphoresis vomiting dizziness or syncope. Moderate intensity when present. Never had anything like this before.   No recent travel trauma hospitalizations or surgeries. No history of DVT or PE. Former smoker, quit several months ago. Does not take any exogenous hormones. No family history of venous thromboembolism  Pain is not affected by eating or position. Does not seem to be related to any particular time of day.     Past Medical History  Diagnosis Date  . Anemia   . Osteoarthritis   . Obesity 07/22/2015     Patient Active Problem List   Diagnosis Date Noted  . Palpitations 07/22/2015  . Tachycardia 07/22/2015  . Mucous in stools 07/22/2015  . Obesity 07/22/2015  . Abnormal finding on EKG 07/22/2015  . Screening for HIV (human immunodeficiency virus) 07/22/2015  . Hyperglycemia 11/06/2014  . Iron deficiency anemia 11/04/2014  . Witnessed apneic spells 11/04/2014  . Breast tenderness in female 11/04/2014  . Axillary lymphadenopathy 11/04/2014  . Encounter for screening mammogram for malignant neoplasm of breast 11/04/2014  . Obesity, Class II, BMI 35-39.9 11/04/2014  . Migraine without aura and without status migrainosus, not intractable 11/04/2014  . Other fatigue 11/04/2014   . Encounter for cholesteral screening for cardiovascular disease 11/04/2014     History reviewed. No pertinent past surgical history.   Current Outpatient Rx  Name  Route  Sig  Dispense  Refill  . famotidine (PEPCID) 20 MG tablet   Oral   Take 1 tablet (20 mg total) by mouth 2 (two) times daily.   60 tablet   0   . ferrous sulfate 325 (65 FE) MG tablet      TAKE ONE TABLET BY MOUTH TWICE DAILY WITH A  MEAL.   60 tablet   2   . ibuprofen (ADVIL,MOTRIN) 800 MG tablet   Oral   Take 1 tablet (800 mg total) by mouth every 8 (eight) hours as needed.   30 tablet   0   . rizatriptan (MAXALT) 10 MG tablet   Oral   Take 10 mg by mouth as needed for migraine. May repeat in 2 hours if needed            Allergies Naproxen and Prednisone   Family History  Problem Relation Age of Onset  . Arthritis Mother   . Diabetes Mother   . Hyperlipidemia Mother   . Hypertension Mother   . Asthma Daughter     Social History Social History  Substance Use Topics  . Smoking status: Former Games developer  . Smokeless tobacco: None  . Alcohol Use: No    Review of Systems  Constitutional:   No fever or chills.  Eyes:   No vision changes.  ENT:   No sore throat. No rhinorrhea. Cardiovascular:   Positive as above chest pain. Respiratory:  No dyspnea or cough. Gastrointestinal:   Negative for abdominal pain, vomiting and diarrhea.  Genitourinary:   Negative for dysuria or difficulty urinating. Musculoskeletal:   Negative for focal pain or swelling Neurological:   Negative for headaches 10-point ROS otherwise negative.  ____________________________________________   PHYSICAL EXAM:  VITAL SIGNS: ED Triage Vitals  Enc Vitals Group     BP 07/22/15 1613 137/82 mmHg     Pulse Rate 07/22/15 1613 101     Resp 07/22/15 1613 18     Temp 07/22/15 1613 98.4 F (36.9 C)     Temp Source 07/22/15 1613 Oral     SpO2 07/22/15 1613 99 %     Weight 07/22/15 1613 268 lb (121.564 kg)      Height 07/22/15 1613 5\' 11"  (1.803 m)     Head Cir --      Peak Flow --      Pain Score 07/22/15 1614 7     Pain Loc --      Pain Edu? --      Excl. in GC? --     Vital signs reviewed, nursing assessments reviewed.   Constitutional:   Alert and oriented. Well appearing and in no distress. Eyes:   No scleral icterus. No conjunctival pallor. PERRL. EOMI.  No nystagmus. ENT   Head:   Normocephalic and atraumatic.   Nose:   No congestion/rhinnorhea. No septal hematoma   Mouth/Throat:   MMM, no pharyngeal erythema. No peritonsillar mass.    Neck:   No stridor. No SubQ emphysema. No meningismus. Hematological/Lymphatic/Immunilogical:   No cervical lymphadenopathy. Cardiovascular:   RRR. Symmetric bilateral radial and DP pulses.  No murmurs.  Respiratory:   Normal respiratory effort without tachypnea nor retractions. Breath sounds are clear and equal bilaterally. No wheezes/rales/rhonchi. Gastrointestinal:   Soft and nontender. Non distended. There is no CVA tenderness.  No rebound, rigidity, or guarding. Genitourinary:   deferred Musculoskeletal:   Nontender with normal range of motion in all extremities. No joint effusions.  No lower extremity tenderness.  No edema. Positive chest wall tenderness over the sternum which does partially reproduce her symptoms. Neurologic:   Normal speech and language.  CN 2-10 normal. Motor grossly intact. No gross focal neurologic deficits are appreciated.  Skin:    Skin is warm, dry and intact. No rash noted.  No petechiae, purpura, or bullae.  ____________________________________________    LABS (pertinent positives/negatives) (all labs ordered are listed, but only abnormal results are displayed) Labs Reviewed  CBC - Abnormal; Notable for the following:    WBC 11.8 (*)    RBC 5.49 (*)    Hemoglobin 11.6 (*)    MCV 67.7 (*)    MCH 21.2 (*)    MCHC 31.3 (*)    RDW 17.6 (*)    All other components within normal limits  TROPONIN I -  Abnormal; Notable for the following:    Troponin I 0.05 (*)    All other components within normal limits  BASIC METABOLIC PANEL  FIBRIN DERIVATIVES D-DIMER ([facility] ONLY)  TROPONIN I   ____________________________________________   EKG  Interpreted by me Normal sinus rhythm rate of 98, normal axis and intervals. Normal QRS ST segments and T waves  ____________________________________________    RADIOLOGY  Chest x-ray unremarkable  ____________________________________________   PROCEDURES   ____________________________________________   INITIAL IMPRESSION / ASSESSMENT AND PLAN / ED COURSE  Pertinent labs & imaging results that were available during my care of the patient were reviewed by  me and considered in my medical decision making (see chart for details).  Patient presents with atypical chest pain. Well-appearing no acute distress.Considering the patient's symptoms, medical history, and physical examination today, I have low suspicion for ACS, PE, TAD, pneumothorax, carditis, mediastinitis, pneumonia, CHF, or sepsis.  A d-dimer was performed which is below the discriminatory threshold for the test. I think the patient is low risk for PE and has no significant risk factors, and a negative d-dimer is a conclusive negative workup. Other labs are unremarkable. An initial troponin was 0.05 which is nonspecific. She is not having anginal type symptoms and I do not believe that she is having ACS or any heart strain or other cardiac event. I repeated the troponin after 2 hour interval, and it was negative at that time. Patient is feeling better after a trial of antacids. I think that most likely her symptoms are related to acid reflux, and I'll keep her on a trial of Pepcid and have her follow up with primary care within the next week. Usual return precautions given come back to emergency department if she has any worsening or new  symptoms.     ____________________________________________   FINAL CLINICAL IMPRESSION(S) / ED DIAGNOSES  Final diagnoses:  Atypical chest pain  Heartburn       Portions of this note were generated with dragon dictation software. Dictation errors may occur despite best attempts at proofreading.   Sharman CheekPhillip Azyah Flett, MD 07/22/15 1944

## 2015-07-22 NOTE — Assessment & Plan Note (Signed)
See AVS

## 2015-07-22 NOTE — Assessment & Plan Note (Signed)
screen 

## 2015-07-22 NOTE — ED Notes (Signed)
MD at bedside. 

## 2015-07-22 NOTE — Assessment & Plan Note (Signed)
Check A1c today; encouraged weight loss; see AVS

## 2015-07-22 NOTE — Patient Instructions (Signed)
Let's get labs today Please see the hematologist about your iron deficiency anemia We'll have you see a gastroenterologist about the mucous in your stools Please return the stool cards at your earliest convenience Check out the information at familydoctor.org entitled "Nutrition for Weight Loss: What You Need to Know about Fad Diets" Try to lose between 1-2 pounds per week by taking in fewer calories and burning off more calories You can succeed by limiting portions, limiting foods dense in calories and fat, becoming more active, and drinking 8 glasses of water a day (64 ounces) Don't skip meals, especially breakfast, as skipping meals may alter your metabolism Do not use over-the-counter weight loss pills or gimmicks that claim rapid weight loss A healthy BMI (or body mass index) is between 18.5 and 24.9 You can calculate your ideal BMI at the NIH website [employer]huhttp://www.nhlbi.nih.gov/health/educational/lose_wt/BMI/bmicalc.htm

## 2015-07-22 NOTE — ED Notes (Signed)
Pt sent from [facility] for possible PE, pt c/o chest pain with SOB for the past week, states worse today. Worse with movement and deep breathing..Marland Kitchen

## 2015-07-22 NOTE — Assessment & Plan Note (Signed)
Many of her symptoms may be explanable by her IDA; she unfortunately never went back to hematologist, never got iron infusion; check labs and refer back to hematologist

## 2015-07-22 NOTE — ED Notes (Signed)
Pt being sent to ER from [facility] for evaluation of CP and EKG changes per Dr. Wende CreaseMalinda Latta. Pt arriving by courtesy car.

## 2015-07-23 LAB — COMPLETE METABOLIC PANEL WITH GFR
ALT: 10 U/L (ref 6–29)
AST: 11 U/L (ref 10–30)
Albumin: 4.3 g/dL (ref 3.6–5.1)
Alkaline Phosphatase: 81 U/L (ref 33–115)
BUN: 11 mg/dL (ref 7–25)
CALCIUM: 9.2 mg/dL (ref 8.6–10.2)
CHLORIDE: 101 mmol/L (ref 98–110)
CO2: 25 mmol/L (ref 20–31)
CREATININE: 0.95 mg/dL (ref 0.50–1.10)
GFR, Est African American: >89 mL/min (ref >=60)
GFR, Est Non African American: 80 mL/min (ref >=60)
GLUCOSE: 115 mg/dL — AB (ref 65–99)
POTASSIUM: 4.4 mmol/L (ref 3.5–5.3)
SODIUM: 139 mmol/L (ref 135–146)
Total Bilirubin: 0.3 mg/dL (ref 0.2–1.2)
Total Protein: 8.3 g/dL — ABNORMAL HIGH (ref 6.1–8.1)

## 2015-07-23 LAB — MAGNESIUM: Magnesium: 1.8 mg/dL (ref 1.5–2.5)

## 2015-07-23 LAB — HIV ANTIBODY (ROUTINE TESTING W REFLEX): HIV: NONREACTIVE

## 2015-08-21 ENCOUNTER — Other Ambulatory Visit: Payer: Self-pay

## 2015-08-21 ENCOUNTER — Ambulatory Visit (INDEPENDENT_AMBULATORY_CARE_PROVIDER_SITE_OTHER): Payer: Medicaid Other | Admitting: Gastroenterology

## 2015-08-21 ENCOUNTER — Encounter: Payer: Self-pay | Admitting: Gastroenterology

## 2015-08-21 VITALS — BP 132/75 | HR 67 | Temp 98.0°F | Ht 71.0 in | Wt 276.0 lb

## 2015-08-21 DIAGNOSIS — D509 Iron deficiency anemia, unspecified: Secondary | ICD-10-CM | POA: Diagnosis not present

## 2015-08-21 NOTE — Progress Notes (Signed)
Gastroenterology Consultation  Referring Provider:     Kerman Passey, MD Primary Care Physician:  Baruch Gouty, MD Primary Gastroenterologist:  Dr. Servando Snare     Reason for Consultation:     Anemia        HPI:   Allison Lucas is a 30 y.o. y/o female referred for consultation & management of Iron deficiency anemia by Dr. Baruch Gouty, MD.  This patient comes today with a history of iron deficiency anemia. The patient reports that she has had anemia for some time. The patient's cause for the anemia has never been found. The patient does report that she has heavy menstrual cycles. The patient also had a low MCV. There is no report of any unexplained weight loss. The patient also denies any fevers chills nausea vomiting black stools or bloody stools. There is no report of any family history of colon cancer colon polyps. The patient denies having any abdominal pain. There is also no history of any diarrhea or constipation. She states she has been in her normal state of health.  Past Medical History  Diagnosis Date  . Anemia   . Osteoarthritis   . Obesity 07/22/2015  . Migraines     Past Surgical History  Procedure Laterality Date  . No surgical hx      Prior to Admission medications   Medication Sig Start Date End Date Taking? Authorizing Provider  ibuprofen (ADVIL,MOTRIN) 800 MG tablet Take 1 tablet (800 mg total) by mouth every 8 (eight) hours as needed. 07/08/14  Yes Charmayne Sheer Beers, PA-C  rizatriptan (MAXALT) 10 MG tablet Take 10 mg by mouth as needed for migraine. May repeat in 2 hours if needed   Yes Historical Provider, MD  famotidine (PEPCID) 20 MG tablet Take 1 tablet (20 mg total) by mouth 2 (two) times daily. Patient not taking: Reported on 08/21/2015 07/22/15   Sharman Cheek, MD  ferrous sulfate 325 (65 FE) MG tablet TAKE ONE TABLET BY MOUTH TWICE DAILY WITH A  MEAL. Patient not taking: Reported on 08/21/2015 03/12/15   Edwena Felty, MD    Family History  Problem Relation  Age of Onset  . Arthritis Mother   . Diabetes Mother   . Hyperlipidemia Mother   . Hypertension Mother   . Asthma Daughter      Social History  Substance Use Topics  . Smoking status: Former Games developer  . Smokeless tobacco: Never Used  . Alcohol Use: No    Allergies as of 08/21/2015 - Review Complete 08/21/2015  Allergen Reaction Noted  . Naproxen Rash 07/08/2014  . Prednisone Rash 07/08/2014    Review of Systems:    All systems reviewed and negative except where noted in HPI.   Physical Exam:  BP 132/75 mmHg  Pulse 67  Temp(Src) 98 F (36.7 C) (Oral)  Ht  (1.803 m)  Wt 276 lb (125.193 kg)  BMI 38.51 kg/m2  LMP 07/13/2015 Patient's last menstrual period was 07/13/2015. Psych:  Alert and cooperative. Normal mood and affect. General:   Alert,  Well-developed, obese, well-nourished, pleasant and cooperative in NAD Head:  Normocephalic and atraumatic. Eyes:  Sclera clear, no icterus.   Conjunctiva pink. Ears:  Normal auditory acuity. Nose:  No deformity, discharge, or lesions. Mouth:  No deformity or lesions,oropharynx pink & moist. Neck:  Supple; no masses or thyromegaly. Lungs:  Respirations even and unlabored.  Clear throughout to auscultation.   No wheezes, crackles, or rhonchi. No acute distress. Heart:  Regular rate  and rhythm; no murmurs, clicks, rubs, or gallops. Abdomen:  Normal bowel sounds.  No bruits.  Soft, non-tender and non-distended without masses, hepatosplenomegaly or hernias noted.  No guarding or rebound tenderness.  Negative Carnett sign.   Rectal:  Deferred.  Msk:  Symmetrical without gross deformities.  Good, equal movement & strength bilaterally. Pulses:  Normal pulses noted. Extremities:  No clubbing or edema.  No cyanosis. Neurologic:  Alert and oriented x3;  grossly normal neurologically. Skin:  Intact without significant lesions or rashes.  No jaundice. Lymph Nodes:  No significant cervical adenopathy. Psych:  Alert and cooperative. Normal  mood and affect.  Imaging Studies: Dg Chest 2 View  07/22/2015  CLINICAL DATA:  Pt having burning pain in her chest today and SOB for a couple of weeks. She said she feels like her heart is racing. Hx of anemia. Former smoker EXAM: CHEST  2 VIEW COMPARISON:  None. FINDINGS: Normal mediastinum and cardiac silhouette. Normal pulmonary vasculature. No evidence of effusion, infiltrate, or pneumothorax. No acute bony abnormality. IMPRESSION: No acute cardiopulmonary process. Electronically Signed   By: Genevive BiStewart  Edmunds M.D.   On: 07/22/2015 16:42    Assessment and Plan:   Allison Lucas is a 30 y.o. y/o female comes today with a history of iron deficiency anemia. The patient has had heavy periods most of her life. The patient denies any sign of any GI bleeding. The patient will be set up for a EGD and colonoscopy to rule out any GI source of her anemia. I have discussed risks & benefits which include, but are not limited to, bleeding, infection, perforation & drug reaction.  The patient agrees with this plan & written consent will be obtained.      Note: This dictation was prepared with Dragon dictation along with smaller phrase technology. Any transcriptional errors that result from this process are unintentional.

## 2015-08-22 ENCOUNTER — Other Ambulatory Visit: Payer: Self-pay

## 2015-08-22 DIAGNOSIS — Z1211 Encounter for screening for malignant neoplasm of colon: Secondary | ICD-10-CM

## 2015-08-22 DIAGNOSIS — Z1212 Encounter for screening for malignant neoplasm of rectum: Principal | ICD-10-CM

## 2015-08-22 MED ORDER — PEG 3350-KCL-NABCB-NACL-NASULF 236 G PO SOLR: 4000.0000 mL | Freq: Once | ORAL | Status: DC

## 2015-08-26 ENCOUNTER — Ambulatory Visit (INDEPENDENT_AMBULATORY_CARE_PROVIDER_SITE_OTHER): Payer: Medicaid Other | Admitting: Cardiology

## 2015-08-26 ENCOUNTER — Encounter: Payer: Self-pay | Admitting: Cardiology

## 2015-08-26 VITALS — BP 110/70 | HR 70 | Ht 71.0 in | Wt 278.0 lb

## 2015-08-26 DIAGNOSIS — R000 Tachycardia, unspecified: Secondary | ICD-10-CM | POA: Diagnosis not present

## 2015-08-26 DIAGNOSIS — R079 Chest pain, unspecified: Secondary | ICD-10-CM | POA: Diagnosis not present

## 2015-08-26 DIAGNOSIS — R9431 Abnormal electrocardiogram [ECG] [EKG]: Secondary | ICD-10-CM | POA: Diagnosis not present

## 2015-08-26 NOTE — Patient Instructions (Addendum)
Medication Instructions:  Your physician recommends that you continue on your current medications as directed. Please refer to the Current Medication list given to you today.   Labwork: None ordered  Testing/Procedures: Your physician has requested that you have an echocardiogram. Echocardiography is a painless test that uses sound waves to create images of your heart. It provides your doctor with information about the size and shape of your heart and how well your heart's chambers and valves are working. This procedure takes approximately one hour. There are no restrictions for this procedure.  Your physician has recommended that you wear a holter monitor. Holter monitors are medical devices that record the heart's electrical activity. Doctors most often use these monitors to diagnose arrhythmias. Arrhythmias are problems with the speed or rhythm of the heartbeat. The monitor is a small, portable device. You can wear one while you do your normal daily activities. This is usually used to diagnose what is causing palpitations/syncope (passing out).    Follow-Up: Your physician recommends that you schedule a follow-up appointment after testing with Dr. Alvino Chapel.  It was a pleasure seeing you today here in the office. Please do not hesitate to give [place] a call back if you have any further questions. [phone]  Florence Cellar RN, BSN       If you need a refill on your cardiac medications before your next appointment, please call your pharmacy.  Echocardiogram An echocardiogram, or echocardiography, uses sound waves (ultrasound) to produce an image of your heart. The echocardiogram is simple, painless, obtained within a short period of time, and offers valuable information to your health care provider. The images from an echocardiogram can provide information such as:  Evidence of coronary artery disease (CAD).  Heart size.  Heart muscle function.  Heart valve function.  Aneurysm  detection.  Evidence of a past heart attack.  Fluid buildup around the heart.  Heart muscle thickening.  Assess heart valve function. LET [place] CARE PROVIDER KNOW ABOUT:  Any allergies you have.  All medicines you are taking, including vitamins, herbs, eye drops, creams, and over-the-counter medicines.  Previous problems you or members of your family have had with the use of anesthetics.  Any blood disorders you have.  Previous surgeries you have had.  Medical conditions you have.  Possibility of pregnancy, if this applies. BEFORE THE PROCEDURE  No special preparation is needed. Eat and drink normally.  PROCEDURE   In order to produce an image of your heart, gel will be applied to your chest and a wand-like tool (transducer) will be moved over your chest. The gel will help transmit the sound waves from the transducer. The sound waves will harmlessly bounce off your heart to allow the heart images to be captured in real-time motion. These images will then be recorded.  You may need an IV to receive a medicine that improves the quality of the pictures. AFTER THE PROCEDURE You may return to your normal schedule including diet, activities, and medicines, unless your health care provider tells you otherwise.   This information is not intended to replace advice given to you by your health care provider. Make sure you discuss any questions you have with your health care provider.   Document Released: 01/16/2000 Document Revised: 02/08/2014 Document Reviewed: 09/25/2012 Elsevier Interactive Patient Education 2016 Elsevier Inc.      Holter Monitoring A Holter monitor is a small device that is used to detect abnormal heart rhythms. It clips to your clothing and is connected  by wires to flat, sticky disks (electrodes) that attach to your chest. It is worn continuously for 24-48 hours. HOME CARE INSTRUCTIONS  Wear your Holter monitor at all times, even while exercising and  sleeping, for as long as directed by your health care provider.  Make sure that the Holter monitor is safely clipped to your clothing or close to your body as recommended by your health care provider.  Do not get the monitor or wires wet.  Do not put body lotion or moisturizer on your chest.  Keep your skin clean.  Keep a diary of your daily activities, such as walking and doing chores. If you feel that your heartbeat is abnormal or that your heart is fluttering or skipping a beat:  Record what you are doing when it happens.  Record what time of day the symptoms occur.  Return your Holter monitor as directed by your health care provider.  Keep all follow-up visits as directed by your health care provider. This is important. SEEK IMMEDIATE MEDICAL CARE IF:  You feel lightheaded or you faint.  You have trouble breathing.  You feel pain in your chest, upper arm, or jaw.  You feel sick to your stomach and your skin is pale, cool, or damp.  You heartbeat feels unusual or abnormal.   This information is not intended to replace advice given to you by your health care provider. Make sure you discuss any questions you have with your health care provider.   Document Released: 10/17/2003 Document Revised: 02/08/2014 Document Reviewed: 08/27/2013 Elsevier Interactive Patient Education [employer]

## 2015-08-26 NOTE — Progress Notes (Signed)
Cardiology Office Note   Date:  08/26/2015   ID:  Allison Lucas, DOB 08/15/1985, MRN [id number]  Referring Doctor:  Baruch Gouty, MD   Cardiologist:   Almond Lint, MD   Reason for consultation:  Chief Complaint  Patient presents with  . Other    ABN EKG c/o chest pain and rapid heart beat. Meds reviewed verbally with pt.      History of Present Illness: Allison Lucas is a 30 y.o. female who presents for Evaluation of abnormal EKG.  A month ago, she presented to her PCP for routine visit. They did an EKG which was noted to be abnormal. She also mentioned that she was having chest pains and palpitations. This has been going on for about a month prior to that visit. She describes the chest pain as a burning sensation in the center of her chest, nonradiating, intense in severity, lasting 15-20 minutes at a time, not related to exertion but randomly occurs. Also, she has been complaining of palpitations or feeling her heart racing. This may happen with the chest pain as well. She describes the palpitations as moderate to severe in intensity, lasting minutes at a time, randomly occurring, symptoms mainly in the chest.  Patient denies loss of consciousness or dizziness. She is undergoing evaluation for anemia. No shortness of breath, PND, orthopnea, edema.  ROS:  Please see the history of present illness. Aside from mentioned under HPI, all other systems are reviewed and negative.     Past Medical History:  Diagnosis Date  . Anemia   . Migraines   . Obesity 07/22/2015  . Osteoarthritis     Past Surgical History:  Procedure Laterality Date  . No surgical hx       reports that she has quit smoking. She has never used smokeless tobacco. She reports that she does not drink alcohol or use drugs.   family history includes Arthritis in her mother; Asthma in her daughter; Diabetes in her mother; Hyperlipidemia in her mother; Hypertension in her mother.   Outpatient Medications  Prior to Visit  Medication Sig Dispense Refill  . ferrous sulfate 325 (65 FE) MG tablet TAKE ONE TABLET BY MOUTH TWICE DAILY WITH A  MEAL. 60 tablet 2  . ibuprofen (ADVIL,MOTRIN) 800 MG tablet Take 1 tablet (800 mg total) by mouth every 8 (eight) hours as needed. 30 tablet 0  . polyethylene glycol (GOLYTELY) 236 g solution Take 4,000 mLs by mouth once. Drink one 8 oz glass every 20 mins until stools are clear. 4000 mL 0  . rizatriptan (MAXALT) 10 MG tablet Take 10 mg by mouth as needed for migraine. May repeat in 2 hours if needed    . famotidine (PEPCID) 20 MG tablet Take 1 tablet (20 mg total) by mouth 2 (two) times daily. (Patient not taking: Reported on 08/21/2015) 60 tablet 0   No facility-administered medications prior to visit.      Allergies: Naproxen and Prednisone    PHYSICAL EXAM: VS:  BP 110/70 (BP Location: Left Arm, Patient Position: Sitting, Cuff Size: Large)   Pulse 70   Ht 5\' 11"  (1.803 m)   Wt 278 lb (126.1 kg)   BMI 38.77 kg/m  , Body mass index is 38.77 kg/m. Wt Readings from Last 3 Encounters:  08/26/15 278 lb (126.1 kg)  08/21/15 276 lb (125.2 kg)  07/22/15 268 lb (121.6 kg)    GENERAL:  well developed, well nourished, obese, not in acute distress HEENT: normocephalic, pink conjunctivae,  anicteric sclerae, no xanthelasma, normal dentition, oropharynx clear NECK:  no neck vein engorgement, JVP normal, no hepatojugular reflux, carotid upstroke brisk and symmetric, no bruit, no thyromegaly, no lymphadenopathy LUNGS:  good respiratory effort, clear to auscultation bilaterally CV:  PMI not displaced, no thrills, no lifts, S1 and S2 within normal limits, no palpable S3 or S4, no murmurs, no rubs, no gallops ABD:  Soft, nontender, nondistended, normoactive bowel sounds, no abdominal aortic bruit, no hepatomegaly, no splenomegaly MS: nontender back, no kyphosis, no scoliosis, no joint deformities EXT:  2+ DP/PT pulses, no edema, no varicosities, no cyanosis, no  clubbing SKIN: warm, nondiaphoretic, normal turgor, no ulcers NEUROPSYCH: alert, oriented to person, place, and time, sensory/motor grossly intact, normal mood, appropriate affect  Recent Labs: 07/22/2015: ALT 10; BUN 11; Creat 0.95; Hemoglobin 11.7; Magnesium 1.8; Platelets 352; Potassium 4.4; Sodium 139; TSH 1.39   Lipid Panel    Component Value Date/Time   CHOL 197 11/04/2014 1046   TRIG 68 11/04/2014 1046   HDL 64 11/04/2014 1046   CHOLHDL 3.1 11/04/2014 1046   LDLCALC 119 (H) 11/04/2014 1046     Other studies Reviewed:  EKG:  The ekg from 07/22/2015 was personally reviewed by me and it revealed sinus rhythm, T-wave inversions noted.  EKG from 08/26/2015 was personally reviewed by me and it revealed sinus rhythm, 70 BPM. Nonspecific T-wave abnormality.  Additional studies/ records that were reviewed personally reviewed by me today include: None available   ASSESSMENT AND PLAN: Chest pain Palpitations Abnormal EKG - changes may be related to breast tissue Patient's symptoms recur further workup with a 24-hour Holter monitor. Recommend an echocardiogram. If positive images is acceptable, we'll proceed with stress echocardiogram.   Current medicines are reviewed at length with the patient today.  The patient does not have concerns regarding medicines.  Labs/ tests ordered today include:  Orders Placed This Encounter  Procedures  . Holter monitor - 24 hour  . EKG 12-Lead  . ECHOCARDIOGRAM COMPLETE    I had a lengthy and detailed discussion with the patient regarding diagnoses, prognosis, diagnostic options, treatment options .   I counseled the patient on importance of lifestyle modification including heart healthy diet, regular physical activity, once cardiac workup is done..   Disposition:   FU with undersigned after tests   Signed, Almond Lint, MD  08/26/2015 11:32 AM    [facility]  This note was generated in part with voice  recognition software and I apologize for any typographical errors that were not detected and corrected.

## 2015-08-28 ENCOUNTER — Encounter: Payer: Self-pay | Admitting: *Deleted

## 2015-08-28 ENCOUNTER — Encounter: Payer: Self-pay | Admitting: Student in an Organized Health Care Education/Training Program

## 2015-08-28 ENCOUNTER — Telehealth: Payer: Self-pay | Admitting: Gastroenterology

## 2015-08-28 NOTE — Telephone Encounter (Signed)
Patient wants to reschedule her colonoscopy from 09/01/2015 to another date. I gave her all of the open appointments for August and September. She will talk to her sister and call me back with a date

## 2015-08-28 NOTE — Telephone Encounter (Signed)
Patient called and needs to reschedule her colonoscopy

## 2015-08-29 NOTE — Discharge Instructions (Signed)

## 2015-08-29 NOTE — Telephone Encounter (Signed)
Patient rescheduled her colonoscopy to 09/25/2015

## 2015-08-29 NOTE — Telephone Encounter (Signed)
Patient is returning your phone call. 

## 2015-08-29 NOTE — Telephone Encounter (Signed)
Returned patients phone call. Mail box was full. Will try again at a later time.

## 2015-08-29 NOTE — Telephone Encounter (Signed)
Called patient again this morning. Room mate picked up and took a message. I will wait for her call back

## 2015-09-10 ENCOUNTER — Other Ambulatory Visit: Payer: Self-pay

## 2015-09-10 ENCOUNTER — Ambulatory Visit (INDEPENDENT_AMBULATORY_CARE_PROVIDER_SITE_OTHER): Payer: Medicaid Other

## 2015-09-10 DIAGNOSIS — R9431 Abnormal electrocardiogram [ECG] [EKG]: Secondary | ICD-10-CM | POA: Diagnosis not present

## 2015-09-10 DIAGNOSIS — R000 Tachycardia, unspecified: Secondary | ICD-10-CM | POA: Diagnosis not present

## 2015-09-10 DIAGNOSIS — R079 Chest pain, unspecified: Secondary | ICD-10-CM

## 2015-09-19 ENCOUNTER — Ambulatory Visit
Admission: RE | Admit: 2015-09-19 | Discharge: 2015-09-19 | Disposition: A | Discharge status: Home / Self Care | Payer: Medicaid Other | Source: Ambulatory Visit | Attending: Cardiology | Admitting: Cardiology

## 2015-09-19 DIAGNOSIS — R079 Chest pain, unspecified: Secondary | ICD-10-CM | POA: Insufficient documentation

## 2015-09-19 DIAGNOSIS — R9431 Abnormal electrocardiogram [ECG] [EKG]: Secondary | ICD-10-CM | POA: Diagnosis present

## 2015-09-19 DIAGNOSIS — R000 Tachycardia, unspecified: Secondary | ICD-10-CM | POA: Diagnosis not present

## 2015-09-24 ENCOUNTER — Telehealth: Payer: Self-pay | Admitting: Family Medicine

## 2015-09-24 DIAGNOSIS — N644 Mastodynia: Secondary | ICD-10-CM

## 2015-09-24 NOTE — Telephone Encounter (Signed)
I never ordered any breast imaging for her; chart reviewed The orders in the chart were originally put in by Allison Lucas in October 2016 I was not working here at that time and she was not my patient For some reason, they were released in June and ended up with my name on them, but we didn't talk about her breast issue at her appt and I didn't order any studies I saw her in June and someone must have released them at her visit, but I didn't know about this ----------------- I called patient She said that she talked to the CMA here when she was here and was told orders were going to be put in I explained that I knew nothing about this and apologized for any confusion; I did explain that Allison Lucas had put orders in back in October that appeared to be released the day she was here with me She says that she has redness in her breasts, she felt something hard in it; she has little sores; it's in both breasts; she isn't sure if there are lumps; she feels something under the armpits too I will absolutely put in new orders for you, I explained and want to help her find out what's going She asked about an iron prescription; I explained it's OTC I urged her to call her hematologist about her iron; she was supposed to go for an iron infusion, but she didn't go last year I put in a referral to Allison Lucas in June 20th but she hasn't been seen yet I asked about Allison Lucas, and she says she saw him one time, but she is going to reschedule the colonoscopy; I urged her to reschedule She did see cardiologist -------------------- Allison Lucas, please make sure the new mammogram and breast US orders get scheduled ASAP and make sure patient sees hematologist (I put in referral June 20th); thank you

## 2015-09-24 NOTE — Assessment & Plan Note (Signed)
Order new mammogram and [place]

## 2015-09-25 ENCOUNTER — Ambulatory Visit: Admission: RE | Admit: 2015-09-25 | Payer: Medicaid Other | Source: Ambulatory Visit | Admitting: Gastroenterology

## 2015-09-25 HISTORY — DX: Other sleep disorders: G47.8

## 2015-09-25 SURGERY — COLONOSCOPY WITH PROPOFOL
Anesthesia: Choice

## 2015-09-26 NOTE — Telephone Encounter (Signed)
Pt scheduled and notified

## 2015-10-07 ENCOUNTER — Other Ambulatory Visit: Payer: Self-pay

## 2015-10-07 NOTE — Telephone Encounter (Signed)
Patient was referred to Dr. Orlie DakinFinnegan for IDA in June; he should be managing her labs and iron; I'll deny Rx

## 2015-10-10 ENCOUNTER — Ambulatory Visit
Admission: RE | Admit: 2015-10-10 | Discharge: 2015-10-10 | Disposition: A | Discharge status: Home / Self Care | Payer: Medicaid Other | Source: Ambulatory Visit | Attending: Family Medicine | Admitting: Family Medicine

## 2015-10-10 ENCOUNTER — Other Ambulatory Visit: Payer: Self-pay | Admitting: Family Medicine

## 2015-10-10 DIAGNOSIS — N644 Mastodynia: Secondary | ICD-10-CM | POA: Diagnosis present

## 2015-10-16 ENCOUNTER — Ambulatory Visit (INDEPENDENT_AMBULATORY_CARE_PROVIDER_SITE_OTHER): Payer: Medicaid Other | Admitting: Family Medicine

## 2015-10-16 ENCOUNTER — Encounter: Payer: Self-pay | Admitting: Family Medicine

## 2015-10-16 VITALS — BP 106/64 | HR 94 | Temp 97.9°F | Resp 14 | Wt 275.0 lb

## 2015-10-16 DIAGNOSIS — M545 Low back pain: Secondary | ICD-10-CM

## 2015-10-16 DIAGNOSIS — N644 Mastodynia: Secondary | ICD-10-CM

## 2015-10-16 DIAGNOSIS — E669 Obesity, unspecified: Secondary | ICD-10-CM

## 2015-10-16 DIAGNOSIS — B372 Candidiasis of skin and nail: Secondary | ICD-10-CM

## 2015-10-16 DIAGNOSIS — N898 Other specified noninflammatory disorders of vagina: Secondary | ICD-10-CM

## 2015-10-16 DIAGNOSIS — G43009 Migraine without aura, not intractable, without status migrainosus: Secondary | ICD-10-CM

## 2015-10-16 DIAGNOSIS — G8929 Other chronic pain: Secondary | ICD-10-CM

## 2015-10-16 DIAGNOSIS — R350 Frequency of micturition: Secondary | ICD-10-CM | POA: Diagnosis not present

## 2015-10-16 LAB — POCT URINALYSIS DIPSTICK
BILIRUBIN UA: NEGATIVE
Glucose, UA: NEGATIVE
Ketones, UA: NEGATIVE
Leukocytes, UA: NEGATIVE
NITRITE UA: NEGATIVE
PH UA: 5.5
Protein, UA: NEGATIVE
RBC UA: NEGATIVE
Spec Grav, UA: 1.02
Urobilinogen, UA: 0.2

## 2015-10-16 MED ORDER — RIZATRIPTAN BENZOATE 10 MG PO TABS: 10.0000 mg | ORAL_TABLET | ORAL | 5 refills | Status: DC | PRN

## 2015-10-16 MED ORDER — NYSTATIN 100000 UNIT/GM EX CREA: 1.0000 "application " | TOPICAL_CREAM | Freq: Two times a day (BID) | CUTANEOUS | 2 refills | Status: AC

## 2015-10-16 MED ORDER — NYSTATIN 100000 UNIT/GM EX POWD: Freq: Three times a day (TID) | CUTANEOUS | 2 refills | Status: AC

## 2015-10-16 NOTE — Assessment & Plan Note (Signed)
Check urine today 

## 2015-10-16 NOTE — Patient Instructions (Signed)
We'll send off your urine and vaginal specimen and contact you about those results Use supportive sports bra to help prevent moisture/fold under the breasts Use the nystatin if needed We'll have you see the physical therapist for your back to start with; if not improving, let me know Check out the information at familydoctor.org entitled "Nutrition for Weight Loss: What You Need to Know about Fad Diets" Try to lose between 1-2 pounds per week by taking in fewer calories and burning off more calories You can succeed by limiting portions, limiting foods dense in calories and fat, becoming more active, and drinking 8 glasses of water a day (64 ounces) Don't skip meals, especially breakfast, as skipping meals may alter your metabolism Do not use over-the-counter weight loss pills or gimmicks that claim rapid weight loss A healthy BMI (or body mass index) is between 18.5 and 24.9 You can calculate your ideal BMI at the NIH website [employer]huhttp://www.nhlbi.nih.gov/health/educational/lose_wt/BMI/bmicalc.htm

## 2015-10-16 NOTE — Progress Notes (Signed)
BP 106/64   Pulse 94   Temp 97.9 F (36.6 C)   Resp 14   Wt 275 lb (124.7 kg)   LMP 09/30/2015   SpO2 97%   BMI 38.35 kg/m    Subjective:    Patient ID: Allison Lucas, female    DOB: October 14, 1985, 30 y.o.   MRN: [id number]  HPI: Allison Lucas is a 30 y.o. female  Chief Complaint  Patient presents with  . Urinary Tract Infection  . Breast Pain    right   She thinks she has a urinary tract infection; urinary frequency; not much comes out; some white discharge down below; not sexually active; drinking plenty of water; no fevers; back pain, has been going on for a while  She is having right breast pain, but the right side is breaking out; comes and goes; redness stays there on both breasts; sharp pains shooting through the breasts; from nipple and shooes back; she gets a rash and it turns dark; going on for a while  Back pain, started about two years ago; hurts in the back lower down; if standing for a long time; feels like something moving in her back; no physical therapy; no xrays; no chance of pregnancy  She has "bad" migraines; she finished all of her maxalt; that is the only thing that helps; not sure about triggers; vision changes, has to turn off lights; going on for years  Depression screen [facility] 2/9 10/16/2015 07/22/2015 11/04/2014  Decreased Interest 0 0 0  Down, Depressed, Hopeless 0 0 0  PHQ - 2 Score 0 0 0   Relevant past medical, surgical, family and social history reviewed Past Medical History:  Diagnosis Date  . Anemia   . Migraines    4-5/wk  . Obesity 07/22/2015  . Osteoarthritis   . Sleep paralysis    Past Surgical History:  Procedure Laterality Date  . No surgical hx     Family History  Problem Relation Age of Onset  . Arthritis Mother   . Diabetes Mother   . Hyperlipidemia Mother   . Hypertension Mother   . Asthma Daughter   . Breast cancer Neg Hx    Social History  Substance Use Topics  . Smoking status: Former Smoker    Quit date:  06/02/2015  . Smokeless tobacco: Never Used  . Alcohol use No   Interim medical history since last visit reviewed. Allergies and medications reviewed  Review of Systems Per HPI unless specifically indicated above     Objective:    BP 106/64   Pulse 94   Temp 97.9 F (36.6 C)   Resp 14   Wt 275 lb (124.7 kg)   LMP 09/30/2015   SpO2 97%   BMI 38.35 kg/m   Wt Readings from Last 3 Encounters:  10/16/15 275 lb (124.7 kg)  08/26/15 278 lb (126.1 kg)  08/21/15 276 lb (125.2 kg)    Physical Exam  Constitutional: She appears well-developed and well-nourished. No distress.  obese  HENT:  Head: Normocephalic and atraumatic.  Eyes: EOM are normal. No scleral icterus.  Neck: No thyromegaly present.  Cardiovascular: Normal rate, regular rhythm and normal heart sounds.   No murmur heard. Pulmonary/Chest: Effort normal and breath sounds normal. No respiratory distress. She has no wheezes. Right breast exhibits tenderness. Right breast exhibits no inverted nipple, no mass, no nipple discharge and no skin change. Left breast exhibits tenderness. Left breast exhibits no inverted nipple, no mass, no nipple discharge and no  skin change.  Large, pendulous breasts  Abdominal: Soft. Bowel sounds are normal. She exhibits no distension.  Genitourinary: Right adnexum displays no tenderness. Left adnexum displays no tenderness. No erythema or bleeding in the vagina. Vaginal discharge (thin watery) found.  Genitourinary Comments: Body habitus limits bimanual exam sensitivity  Musculoskeletal: She exhibits no edema.  Neurological: She is alert. She exhibits normal muscle tone.  Skin: Skin is warm and dry. Rash (mild erythema with well-demarcated borders under the breasts, left > right) noted. She is not diaphoretic. No pallor.  Psychiatric: Her behavior is normal. Judgment and thought content normal. Her mood appears not anxious. Her affect is blunt. She does not exhibit a depressed mood.        Assessment & Plan:   Problem List Items Addressed This Visit      Cardiovascular and Mediastinum   Migraine without aura and without status migrainosus, not intractable    Refill of maxalt provided per her request; BP controlled      Relevant Medications   rizatriptan (MAXALT) 10 MG tablet     Other   Vaginal discharge    Check wet mount today      Relevant Orders   WET PREP BY MOLECULAR PROBE   WET PREP BY MOLECULAR PROBE (Completed)   Urinary frequency - Primary    Check urine today      Relevant Orders   POCT urinalysis dipstick (Completed)   Urine Culture (Completed)   Obesity, Class II, BMI 35-39.9    See AVS for advice on weight loss      Chronic lumbar pain    Refer to PT      Relevant Orders   Ambulatory referral to Physical Therapy   Breast tenderness in female    Will order mammogram again, as well as US      Relevant Orders   MM Digital Diagnostic Bilat   US BREAST LTD UNI LEFT INC AXILLA   US BREAST LTD UNI RIGHT INC AXILLA    Other Visit Diagnoses    Candidiasis of skin       topical agent Rxd; advised supportive bra to help lessen skin folds, source of moisture and warmth for organism   Relevant Medications   nystatin cream (MYCOSTATIN)   nystatin (MYCOSTATIN/NYSTOP) powder       Follow up plan: No Follow-up on file.  An after-visit summary was printed and given to the patient at check-out.  Please see the patient instructions which may contain other information and recommendations beyond what is mentioned above in the assessment and plan.  Meds ordered this encounter  Medications  . rizatriptan (MAXALT) 10 MG tablet    Sig: Take 1 tablet (10 mg total) by mouth as needed for migraine. May repeat in 2 hours if needed    Dispense:  10 tablet    Refill:  5  . nystatin cream (MYCOSTATIN)    Sig: Apply 1 application topically 2 (two) times daily. PRN    Dispense:  30 g    Refill:  2  . nystatin (MYCOSTATIN/NYSTOP) powder    Sig: Apply  topically 3 (three) times daily. PRN rash    Dispense:  30 g    Refill:  2    Orders Placed This Encounter  Procedures  . Urine Culture  . WET PREP BY MOLECULAR PROBE  . WET PREP BY MOLECULAR PROBE  . MM Digital Diagnostic Bilat  . US BREAST LTD UNI LEFT INC AXILLA  . US BREAST  LTD UNI RIGHT INC AXILLA  . Ambulatory referral to Physical Therapy  . POCT urinalysis dipstick

## 2015-10-16 NOTE — Assessment & Plan Note (Signed)
Check wet mount today

## 2015-10-16 NOTE — Assessment & Plan Note (Signed)
Refer to PT

## 2015-10-17 LAB — WET PREP BY MOLECULAR PROBE
Candida species: NEGATIVE
Gardnerella vaginalis: NEGATIVE
Trichomonas vaginosis: NEGATIVE

## 2015-10-18 LAB — URINE CULTURE

## 2015-10-22 NOTE — Assessment & Plan Note (Signed)
See AVS for advice on weight loss

## 2015-10-22 NOTE — Assessment & Plan Note (Signed)
Will order mammogram again, as well as [place]

## 2015-10-22 NOTE — Assessment & Plan Note (Addendum)
Refill of maxalt provided per her request; BP controlled

## 2016-03-16 ENCOUNTER — Encounter: Payer: Self-pay | Admitting: Family Medicine

## 2016-03-16 ENCOUNTER — Ambulatory Visit (INDEPENDENT_AMBULATORY_CARE_PROVIDER_SITE_OTHER): Payer: Medicaid Other | Admitting: Family Medicine

## 2016-03-16 VITALS — BP 104/62 | HR 97 | Temp 98.6°F | Resp 16 | Ht 71.0 in | Wt 270.4 lb

## 2016-03-16 DIAGNOSIS — N62 Hypertrophy of breast: Secondary | ICD-10-CM | POA: Diagnosis not present

## 2016-03-16 DIAGNOSIS — E669 Obesity, unspecified: Secondary | ICD-10-CM

## 2016-03-16 DIAGNOSIS — R109 Unspecified abdominal pain: Secondary | ICD-10-CM | POA: Diagnosis not present

## 2016-03-16 DIAGNOSIS — G43109 Migraine with aura, not intractable, without status migrainosus: Secondary | ICD-10-CM

## 2016-03-16 DIAGNOSIS — L309 Dermatitis, unspecified: Secondary | ICD-10-CM | POA: Diagnosis not present

## 2016-03-16 MED ORDER — TRIAMCINOLONE ACETONIDE 0.1 % EX CREA: 1.0000 "application " | TOPICAL_CREAM | Freq: Two times a day (BID) | CUTANEOUS | 0 refills | Status: AC

## 2016-03-16 MED ORDER — HYOSCYAMINE SULFATE 0.125 MG PO TABS: 0.1250 mg | ORAL_TABLET | Freq: Four times a day (QID) | ORAL | 3 refills | Status: AC | PRN

## 2016-03-16 MED ORDER — RIZATRIPTAN BENZOATE 10 MG PO TBDP: 10.0000 mg | ORAL_TABLET | ORAL | 11 refills | Status: DC | PRN

## 2016-03-16 MED ORDER — FERROUS SULFATE 325 (65 FE) MG PO TABS: 325.0000 mg | ORAL_TABLET | Freq: Every day | ORAL | 3 refills | Status: AC

## 2016-03-16 NOTE — Progress Notes (Signed)
BP 104/62   Pulse 97   Temp 98.6 F (37 C) (Oral)   Resp 16   Ht 5\' 11"  (1.803 m)   Wt 270 lb 6 oz (122.6 kg)   LMP 02/29/2016   SpO2 98%   BMI 37.71 kg/m    Subjective:    Patient ID: Allison Lucas, female    DOB: [birth year], 31 y.o.   MRN: [id number]  HPI: Allison Lucas is a 31 y.o. female  Chief Complaint  Patient presents with  . Migraine    Medication not working, Poss reaction to Meds.  . Back Pain    Chronic pain   . Rash    chest redness   . Spasms    stomach spams since december; reocurring    Migraines; lasting 4-5 days at a time; has light sensitivity; using triptan, but the last ones are pink and different from the last ones; makes her heart beat fast and throat close up; this was the non-MLT maxalt; would like to see neurologist; no known triggers  Chronic back pain; "I don't know what's going on with this back" she says; can't stand for too long; lower back and in her shoulders; pain patches tried, didn't help beyond a few minutes; no old injuries; father had to have back surgery; some kind of cancer, some kind of rare cancer; he's in [place]; she has large breasts; would love to have them reduced; large breasts run in the family; bra cup size is "too big"; size is 50DDD, one is an L something and they are different sizes; grooves in the shoudlers from bra straps  She has a rash on her chest; itches on her chest; getting on her arm too; no blisters  She has been having stomach spasms; weird stuff, has been googling it; felt like muscle spasms; some discomfort; really happens at night; not affected by food; no blood in the stool; no nausea or vominting, maybe a little nausea she said after thinking about it; took ibuprofen, made it worse; no acid reflux, but does have burning in the chest; not tried pepto-bismol; moving bowels regularly  [facility] 03/16/2016 10/16/2015 07/22/2015 11/04/2014  Decreased Interest 0 0 0 0  Down, Depressed, Hopeless 0  0 0 0  PHQ - 2 Score 0 0 0 0   Relevant past medical, surgical, family and social history reviewed Past Medical History:  Diagnosis Date  . Anemia   . Migraine with aura 11/04/2014  . Migraines    4-5/wk  . Obesity 07/22/2015  . Osteoarthritis   . Sleep paralysis    Past Surgical History:  Procedure Laterality Date  . No surgical hx     Family History  Problem Relation Age of Onset  . Arthritis Mother   . Diabetes Mother   . Hyperlipidemia Mother   . Hypertension Mother   . Asthma Daughter   . Breast cancer Neg Hx    Social History  Substance Use Topics  . Smoking status: Former Smoker    Quit date: 06/02/2015  . Smokeless tobacco: Never Used  . Alcohol use No   Interim medical history since last visit reviewed. Allergies and medications reviewed  Review of Systems Per HPI unless specifically indicated above     Objective:    BP 104/62   Pulse 97   Temp 98.6 F (37 C) (Oral)   Resp 16   Ht 5\' 11"  (1.803 m)   Wt 270 lb 6 oz (122.6 kg)  LMP 02/29/2016   SpO2 98%   BMI 37.71 kg/m   Wt Readings from Last 3 Encounters:  03/16/16 270 lb 6 oz (122.6 kg)  10/16/15 275 lb (124.7 kg)  08/26/15 278 lb (126.1 kg)    Physical Exam  Constitutional: She appears well-developed and well-nourished. No distress.  obese  HENT:  Head: Normocephalic and atraumatic.  Eyes: EOM are normal. No scleral icterus.  Cardiovascular: Normal rate, regular rhythm and normal heart sounds.   No murmur heard. Pulmonary/Chest: Effort normal and breath sounds normal.  Very large, pendulous breasts  Abdominal: Soft. Bowel sounds are normal.  Musculoskeletal: She exhibits no edema.  Grooves in the shoulders where bra straps cross  Neurological: She is alert.  Skin: Skin is warm and dry. Rash (mild erythema across upper chest midline and right of midline) noted. She is not diaphoretic. No pallor.  Psychiatric: Her behavior is normal. Judgment and thought content normal. Her mood appears  not anxious. She does not exhibit a depressed mood.      Assessment & Plan:   Problem List Items Addressed This Visit      Cardiovascular and Mediastinum   Migraine with aura - Primary (Chronic)    Refer to neurologist at patient's request; use triptan, new Rx sent; avoid triggers      Relevant Medications   rizatriptan (MAXALT-MLT) 10 MG disintegrating tablet   Other Relevant Orders   Ambulatory referral to Neurology     Other   Obesity, Class II, BMI 35-39.9    Patient has been encouraged to continue to work on weight loss, and has lost 5 pounds over the last 5 months      Breast hypertrophy in female (Chronic)    Refer to plastic surgeon for consideration of reduction mammoplasty; I believe her back pain is related to her breast hypertrophy, and reduction mammoplasty would help her back pain      Relevant Orders   Ambulatory referral to Plastic Surgery   Abdominal spasms    Will try levsin; call if not improving; no red flags       Other Visit Diagnoses    Dermatitis       treat with mild topical corticosteroid; too strong for face, groin, underarms       Follow up plan: No Follow-up on file.  An after-visit summary was printed and given to the patient at check-out.  Please see the patient instructions which may contain other information and recommendations beyond what is mentioned above in the assessment and plan.  Meds ordered this encounter  Medications  . rizatriptan (MAXALT-MLT) 10 MG disintegrating tablet    Sig: Take 1 tablet (10 mg total) by mouth as needed for migraine. May repeat in 2 hours if needed    Dispense:  10 tablet    Refill:  11  . triamcinolone cream (KENALOG) 0.1 %    Sig: Apply 1 application topically 2 (two) times daily. Too strong for face    Dispense:  30 g    Refill:  0  . hyoscyamine (LEVSIN) 0.125 MG tablet    Sig: Take 1 tablet (0.125 mg total) by mouth every 6 (six) hours as needed.    Dispense:  30 tablet    Refill:  3  .  ferrous sulfate 325 (65 FE) MG tablet    Sig: Take 1 tablet (325 mg total) by mouth daily with breakfast.    Dispense:  30 tablet    Refill:  3  . vitamin  C (ASCORBIC ACID) 500 MG tablet    Sig: Take 1 tablet (500 mg total) by mouth daily.    Orders Placed This Encounter  Procedures  . Ambulatory referral to Neurology  . Ambulatory referral to Plastic Surgery

## 2016-03-16 NOTE — Assessment & Plan Note (Addendum)
Refer to plastic surgeon for consideration of reduction mammoplasty; I believe her back pain is related to her breast hypertrophy, and reduction mammoplasty would help her back pain

## 2016-03-16 NOTE — Patient Instructions (Addendum)
Please find out what kind of cancer your father had and call my staff to update your chart I have put in referrals to neurology and plastic surgery Try the new medicine for the rash and the stomach concerns Either come back in or call in a month about your skin and stomach

## 2016-03-16 NOTE — Assessment & Plan Note (Signed)
Refer to neurologist at patient's request; use triptan, new Rx sent; avoid triggers

## 2016-03-23 DIAGNOSIS — R109 Unspecified abdominal pain: Secondary | ICD-10-CM | POA: Insufficient documentation

## 2016-03-23 NOTE — Assessment & Plan Note (Signed)
Patient has been encouraged to continue to work on weight loss, and has lost 5 pounds over the last 5 months

## 2016-03-23 NOTE — Assessment & Plan Note (Signed)
Will try levsin; call if not improving; no red flags

## 2016-04-15 ENCOUNTER — Other Ambulatory Visit: Payer: Self-pay | Admitting: Family Medicine

## 2016-04-15 MED ORDER — VITAMIN C 500 MG PO TABS: 500.0000 mg | ORAL_TABLET | Freq: Every day | ORAL | 2 refills | Status: AC

## 2016-04-16 ENCOUNTER — Emergency Department
Admission: EM | Admit: 2016-04-16 | Discharge: 2016-04-16 | Disposition: A | Discharge status: Home / Self Care | Payer: Medicaid Other | Attending: Emergency Medicine | Admitting: Emergency Medicine

## 2016-04-16 ENCOUNTER — Encounter: Payer: Self-pay | Admitting: Emergency Medicine

## 2016-04-16 DIAGNOSIS — Z87891 Personal history of nicotine dependence: Secondary | ICD-10-CM | POA: Insufficient documentation

## 2016-04-16 DIAGNOSIS — N760 Acute vaginitis: Secondary | ICD-10-CM | POA: Diagnosis not present

## 2016-04-16 DIAGNOSIS — R102 Pelvic and perineal pain: Secondary | ICD-10-CM

## 2016-04-16 DIAGNOSIS — B9689 Other specified bacterial agents as the cause of diseases classified elsewhere: Secondary | ICD-10-CM

## 2016-04-16 LAB — WET PREP, GENITAL
Clue Cells Wet Prep HPF POC: NONE SEEN
SPERM: NONE SEEN
TRICH WET PREP: NONE SEEN
YEAST WET PREP: NONE SEEN

## 2016-04-16 LAB — URINALYSIS, COMPLETE (UACMP) WITH MICROSCOPIC
Bilirubin Urine: NEGATIVE
Glucose, UA: NEGATIVE mg/dL
Hgb urine dipstick: NEGATIVE
Ketones, ur: NEGATIVE mg/dL
Leukocytes, UA: NEGATIVE
Nitrite: NEGATIVE
Protein, ur: NEGATIVE mg/dL
Specific Gravity, Urine: 1.017 (ref 1.005–1.030)
pH: 7 (ref 5.0–8.0)

## 2016-04-16 LAB — BASIC METABOLIC PANEL
Anion gap: 6 (ref 5–15)
BUN: 8 mg/dL (ref 6–20)
CHLORIDE: 105 mmol/L (ref 101–111)
CO2: 25 mmol/L (ref 22–32)
CREATININE: 0.71 mg/dL (ref 0.44–1.00)
Calcium: 8.9 mg/dL (ref 8.9–10.3)
GFR calc Af Amer: >60 mL/min (ref >60)
GFR calc non Af Amer: >60 mL/min (ref >60)
GLUCOSE: 106 mg/dL — AB (ref 65–99)
POTASSIUM: 3.6 mmol/L (ref 3.5–5.1)
Sodium: 136 mmol/L (ref 135–145)

## 2016-04-16 LAB — CBC
HEMATOCRIT: 33.3 % — AB (ref 35.0–47.0)
HEMOGLOBIN: 10.7 g/dL — AB (ref 12.0–16.0)
MCH: 20.8 pg — AB (ref 26.0–34.0)
MCHC: 32.3 g/dL (ref 32.0–36.0)
MCV: 64.6 fL — AB (ref 80.0–100.0)
Platelets: 279 10*3/uL (ref 150–440)
RBC: 5.16 MIL/uL (ref 3.80–5.20)
RDW: 19.5 % — ABNORMAL HIGH (ref 11.5–14.5)
WBC: 8.4 10*3/uL (ref 3.6–11.0)

## 2016-04-16 LAB — CHLAMYDIA/NGC RT PCR (ARMC ONLY)
Chlamydia Tr: NOT DETECTED
N GONORRHOEAE: NOT DETECTED

## 2016-04-16 LAB — POCT PREGNANCY, URINE: PREG TEST UR: NEGATIVE

## 2016-04-16 MED ORDER — METRONIDAZOLE 500 MG PO TABS: 500.0000 mg | ORAL_TABLET | Freq: Two times a day (BID) | ORAL | 0 refills | Status: AC

## 2016-04-16 MED ORDER — HYDROCODONE-ACETAMINOPHEN 5-325 MG PO TABS: 1.0000 | ORAL_TABLET | Freq: Four times a day (QID) | ORAL | 0 refills | Status: AC | PRN

## 2016-04-16 NOTE — ED Notes (Signed)
Pt discharged home after verbalizing understanding of discharge instructions; nad noted. 

## 2016-04-16 NOTE — Discharge Instructions (Signed)
Please contact the OB/GYN for follow-up as soon as possible specifically for a Pap smear and cervical evaluation. He can take Tylenol over-the-counter for pain. Return here for fever, increasing abdominal pain, or any other new concerns.  Please return immediately if condition worsens. Please contact her primary physician or the physician you were given for referral. If you have any specialist physicians involved in her treatment and plan please also contact them. Thank you for using [facility].

## 2016-04-16 NOTE — ED Triage Notes (Signed)
Patient c/o pelvic pain, back pain and vaginal discharge x 2 days. Patient denies vaginal itching or odor. Patient states there is a pressure in her vagina. Patient denies N/V/D. Patient states discharge is thick yellow.

## 2016-04-16 NOTE — ED Provider Notes (Signed)
Time Seen: Approximately *1517  I have reviewed the triage notes  Chief Complaint: Pelvic Pain   History of Present Illness: Allison Lucas is a 31 y.o. female who has a history of chronic anemia and presents with a recent complaint of suprapubic abdominal pain and some back and flank discomfort with a vaginal discharge over the last 2 days. She describes it as taken yellow in appearance. She states she hasn't been sexually active for approximately 2 years. She has no local OB/GYN and is also not had a Pap smear for several years.*   Past Medical History:  Diagnosis Date  . Anemia   . Migraine with aura 11/04/2014  . Migraines    4-5/wk  . Obesity 07/22/2015  . Osteoarthritis   . Sleep paralysis     Patient Active Problem List   Diagnosis Date Noted  . Abdominal spasms 03/23/2016  . Breast hypertrophy in female 03/16/2016  . Urinary frequency 10/16/2015  . Chronic lumbar pain 10/16/2015  . Palpitations 07/22/2015  . Mucous in stools 07/22/2015  . Abnormal finding on EKG 07/22/2015  . Screening for HIV (human immunodeficiency virus) 07/22/2015  . Hyperglycemia 11/06/2014  . Iron deficiency anemia 11/04/2014  . Witnessed apneic spells 11/04/2014  . Breast tenderness in female 11/04/2014  . Axillary lymphadenopathy 11/04/2014  . Encounter for screening mammogram for malignant neoplasm of breast 11/04/2014  . Obesity, Class II, BMI 35-39.9 11/04/2014  . Migraine with aura 11/04/2014  . Other fatigue 11/04/2014  . Encounter for cholesteral screening for cardiovascular disease 11/04/2014    Past Surgical History:  Procedure Laterality Date  . No surgical hx      Past Surgical History:  Procedure Laterality Date  . No surgical hx      Current Outpatient Rx  . Order #: [id number] Class: Normal  . Order #: [id number] Class: Print  . Order #: [id number] Class: Normal  . Order #: [id number] Class: Print  . Order #: [id number] Class: Normal  . Order #: [id number] Class: Normal   . Order #: [id number] Class: Normal  . Order #: [id number] Class: Normal  . Order #: [id number] Class: Normal    Allergies:  Cherry; Maxalt [rizatriptan benzoate]; Naproxen; and Prednisone  Family History: Family History  Problem Relation Age of Onset  . Arthritis Mother   . Diabetes Mother   . Hyperlipidemia Mother   . Hypertension Mother   . Asthma Daughter   . Breast cancer Neg Hx     Social History: Social History  Substance Use Topics  . Smoking status: Former Smoker    Quit date: 06/02/2015  . Smokeless tobacco: Never Used  . Alcohol use No     Review of Systems:   10 point review of systems was performed and was otherwise negative:  Constitutional: No fever Eyes: No visual disturbances ENT: No sore throat, ear pain Cardiac: No chest pain Respiratory: No shortness of breath, wheezing, or stridor Abdomen: NoCurrent abdominal pain, no vomiting, No diarrhea Endocrine: No weight loss, No night sweats Extremities: No peripheral edema, cyanosis Skin: No rashes, easy bruising Neurologic: No focal weakness, trouble with speech or swollowing Urologic: No dysuria, Hematuria, or urinary frequency She denies any abnormal vaginal bleeding. She denies any other concerns  Physical Exam:  ED Triage Vitals  Enc Vitals Group     BP 04/16/16 1217 (!) 146/87     Pulse Rate 04/16/16 1217 75     Resp 04/16/16 1217 20     Temp 04/16/16 1217 97.9 F (36.6 C)     Temp  Source 04/16/16 1217 Oral     SpO2 04/16/16 1217 100 %     Weight 04/16/16 1217 250 lb (113.4 kg)     Height 04/16/16 1217 5\' 11"  (1.803 m)     Head Circumference --      Peak Flow --      Pain Score 04/16/16 1224 8     Pain Loc --      Pain Edu? --      Excl. in GC? --     General: Awake , Alert , and Oriented times 3; GCS 15 Head: Normal cephalic , atraumatic Eyes: Pupils equal , round, reactive to light Nose/Throat: No nasal drainage, patent upper airway without erythema or exudate.  Neck: Supple, Full  range of motion, No anterior adenopathy or palpable thyroid masses Lungs: Clear to ascultation without wheezes , rhonchi, or rales Heart: Regular rate, regular rhythm without murmurs , gallops , or rubs Abdomen: Soft, non tender without rebound, guarding , or rigidity; bowel sounds positive and symmetric in all 4 quadrants. No organomegaly .        Extremities: 2 plus symmetric pulses. No edema, clubbing or cyanosis Neurologic: normal ambulation, Motor symmetric without deficits, sensory intact Skin: warm, dry, no rashes Pelvic exam with chaperone present shows what appears to be an irritated cervix with a somewhat blue hue over the base of the cervix with a small amount of green vaginal discharge. No cervical motion tenderness and no adnexal tenderness or masses noted on bimanual exam. Wet prep, GC and Chlamydia, were obtained.  Labs:   All laboratory work was reviewed including any pertinent negatives or positives listed below:  Labs Reviewed  WET PREP, GENITAL - Abnormal; Notable for the following:       Result Value   WBC, Wet Prep HPF POC RARE (*)    All other components within normal limits  URINALYSIS, COMPLETE (UACMP) WITH MICROSCOPIC - Abnormal; Notable for the following:    Color, Urine YELLOW (*)    APPearance CLEAR (*)    Bacteria, UA RARE (*)    Squamous Epithelial / LPF 0-5 (*)    All other components within normal limits  CBC - Abnormal; Notable for the following:    Hemoglobin 10.7 (*)    HCT 33.3 (*)    MCV 64.6 (*)    MCH 20.8 (*)    RDW 19.5 (*)    All other components within normal limits  BASIC METABOLIC PANEL - Abnormal; Notable for the following:    Glucose, Bld 106 (*)    All other components within normal limits  CHLAMYDIA/NGC RT PCR ([facility] ONLY)  POC URINE PREG, ED  POCT PREGNANCY, URINE     ED Course: * Patient's pelvic pain does not appear to be surgical in nature nor does it appear to be pelvic inflammatory disease based on her history.  The patient's pain is unknown at this time but what I felt was important was for her to have a OB/GYN evaluation with a cervical exam and Pap smear evaluation. I advised her this needs to happen over the near future as soon as possible. Patient appears to be compliant and states she will contact the OB GYN she was referred to along with her primary physician. Her clue cells were negative for the discharge appears to be bacterial vaginosis and with the patient not being sexually active and is not appearing to be yeast infection I thought she may benefit with oral antibiotic therapy.  Final Clinical Impression: *  Final diagnoses:  Pelvic pain in female  Bacterial vaginosis     Plan:  Outpatient " Discharge Medication List as of 04/16/2016  5:26 PM    START taking these medications   Details  HYDROcodone-acetaminophen (NORCO) 5-325 MG tablet Take 1 tablet by mouth every 6 (six) hours as needed for moderate pain., Starting Fri 04/16/2016, Print    metroNIDAZOLE (FLAGYL) 500 MG tablet Take 1 tablet (500 mg total) by mouth 2 (two) times daily., Starting Fri 04/16/2016, Until Fri 04/23/2016, Print      " Patient was advised to return immediately if condition worsens. Patient was advised to follow up with their primary care physician or other specialized physicians involved in their outpatient care. The patient and/or family member/power of attorney had laboratory results reviewed at the bedside. All questions and concerns were addressed and appropriate discharge instructions were distributed by the nursing staff.             Jennye Moccasin, MD 04/16/16 [phone]

## 2016-04-16 NOTE — ED Notes (Signed)
FIRST NURSE NOTE: Pt reports vaginal pressure and feels like spasms. Pt also c/o flank pain that radiates to lower abdomen, Pt states pain feels like burning.  Flank pain x 2 days 8/10, vaginal pain 9/10 started last night.

## 2016-04-16 NOTE — ED Notes (Signed)
Pt reports lower back pain x several months, worsening in last 2 days. She also reports pressure in her vaginal area and lower abdomen since last night. She states that it feels like spasms; intermittent in nature. Pt also reports vaginal discharge (clear and yellow). Pt alert & oriented with NAD noted.

## 2016-05-07 ENCOUNTER — Other Ambulatory Visit: Payer: Self-pay | Admitting: Nurse Practitioner

## 2016-05-07 DIAGNOSIS — G43119 Migraine with aura, intractable, without status migrainosus: Secondary | ICD-10-CM

## 2016-05-13 ENCOUNTER — Ambulatory Visit
Admission: RE | Admit: 2016-05-13 | Discharge: 2016-05-13 | Disposition: A | Discharge status: Home / Self Care | Payer: Medicaid Other | Source: Ambulatory Visit | Attending: Nurse Practitioner | Admitting: Nurse Practitioner

## 2016-05-13 DIAGNOSIS — G43119 Migraine with aura, intractable, without status migrainosus: Secondary | ICD-10-CM | POA: Diagnosis present

## 2016-05-13 MED ORDER — IOPAMIDOL (ISOVUE-300) INJECTION 61%
75.0000 mL | Freq: Once | INTRAVENOUS | Status: AC | PRN
  Administered 2016-05-13: 75 mL via INTRAVENOUS

## 2016-06-10 ENCOUNTER — Ambulatory Visit: Payer: Medicaid Other | Admitting: Family Medicine

## 2016-06-10 ENCOUNTER — Encounter: Payer: Self-pay | Admitting: Family Medicine

## 2016-06-15 ENCOUNTER — Ambulatory Visit: Payer: Medicaid Other | Admitting: Family Medicine

## 2016-06-17 ENCOUNTER — Ambulatory Visit (INDEPENDENT_AMBULATORY_CARE_PROVIDER_SITE_OTHER): Payer: Medicaid Other | Admitting: Family Medicine

## 2016-06-17 ENCOUNTER — Encounter: Payer: Self-pay | Admitting: Family Medicine

## 2016-06-17 DIAGNOSIS — N62 Hypertrophy of breast: Secondary | ICD-10-CM

## 2016-06-17 DIAGNOSIS — E669 Obesity, unspecified: Secondary | ICD-10-CM

## 2016-06-17 DIAGNOSIS — G43109 Migraine with aura, not intractable, without status migrainosus: Secondary | ICD-10-CM

## 2016-06-17 DIAGNOSIS — M545 Low back pain, unspecified: Secondary | ICD-10-CM

## 2016-06-17 DIAGNOSIS — G8929 Other chronic pain: Secondary | ICD-10-CM | POA: Diagnosis not present

## 2016-06-17 MED ORDER — GABAPENTIN 100 MG PO CAPS: ORAL_CAPSULE | ORAL | 0 refills | Status: AC

## 2016-06-17 NOTE — Assessment & Plan Note (Signed)
Refer to bariatric clinic

## 2016-06-17 NOTE — Assessment & Plan Note (Signed)
Start magnesium, start gabapentin if neurologist agrees

## 2016-06-17 NOTE — Assessment & Plan Note (Signed)
I suspect that this is due to breast hypertrophy and I believe that reduction mammoplasty would help her pain and quality of life; however, cannot get covered so will refer to back doctor

## 2016-06-17 NOTE — Assessment & Plan Note (Signed)
Unfortunately, Medicaid won't cover reduction mammoplasty

## 2016-06-17 NOTE — Patient Instructions (Addendum)
Start magnesium 400 to 600 mg daily for migraine prophylaxis Start the gabapentin after making sure it's okay with your neurologist Check out the information at familydoctor.org entitled "Nutrition for Weight Loss: What You Need to Know about Fad Diets" Try to lose between 1-2 pounds per week by taking in fewer calories and burning off more calories You can succeed by limiting portions, limiting foods dense in calories and fat, becoming more active, and drinking 8 glasses of water a day (64 ounces) Don't skip meals, especially breakfast, as skipping meals may alter your metabolism Do not use over-the-counter weight loss pills or gimmicks that claim rapid weight loss A healthy BMI (or body mass index) is between 18.5 and 24.9 You can calculate your ideal BMI at the NIH website [employer].huhttp://www.nhlbi.nih.gov/health/educational/lose_wt/BMI/bmicalc.htm Tylenol per package directions for pain

## 2016-06-17 NOTE — Progress Notes (Signed)
BP 106/74   Pulse 75   Temp 97.8 F (36.6 C)   Resp 14   Wt 268 lb 4.8 oz (121.7 kg)   LMP 06/15/2016   SpO2 98%   BMI 37.42 kg/m    Subjective:    Patient ID: Allison Lucas, female    DOB: 03-Dec-1985, 31 y.o.   MRN: [id number]  HPI: Allison Lucas is a 31 y.o. female  Chief Complaint  Patient presents with  . Follow-up  . headaches  . Joint Pain  . 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 Manuella Ghazi and sees Rufina Falco; just saw them on 05/27/16; started her on propranolol; she started coughing up blood, no sinus problems, kind of weird; was on fioricet but she stopped that (irritable); also tried topamax and had to stop that (heart beat fast)  ------------------------------------------------------------- CT HEAD W & WO CONTRAST 05-13-2016  CLINICAL DATA:Migraines for the past several days.  EXAM: CT HEAD WITHOUT AND WITH CONTRAST  TECHNIQUE: Contiguous axial images were obtained from the base of the skull through the vertex without and with intravenous contrast  CONTRAST:61m ISOVUE-300 IOPAMIDOL (ISOVUE-300) INJECTION 61%  COMPARISON:None.  FINDINGS: Brain: No evidence of acute infarction, hemorrhage, hydrocephalus, extra-axial collection or mass lesion/mass effect. Normal cerebral volume. No white matter disease. Post infusion, no abnormal enhancement.  Vascular: No hyperdense vessel or unexpected calcification. Visible vessels are patent.  Skull: Normal. Negative for fracture or focal lesion.  Sinuses/Orbits: No  acute finding.  Other: None.  IMPRESSION: Negative exam.  Electronically Signed By: JStaci RighterM.D. On: 05/13/2016 16:48  -----------------------------------------------------  She has joint point; shooting pain from her knees down, feels like fire in the bones; hurts; back hurts from shoulders down; Medicaid will not pay for breast reduction mammoplasty  Obesity; does not stress eat  Back pain is worse than the headaches; can't stand for a long time; wants to get out and work but pain is too great; has been given norco for something else  [facility] 06/17/2016 03/16/2016 10/16/2015 07/22/2015 11/04/2014  Decreased Interest 0 0 0 0 0  Down, Depressed, Hopeless 1 0 0 0 0  PHQ - 2 Score 1 0 0 0 0    Relevant past medical, surgical, family and social history reviewed Past Medical History:  Diagnosis Date  . Anemia   . Migraine with aura 11/04/2014  . Migraines    4-5/wk  . Obesity 07/22/2015  . Osteoarthritis   . Sleep paralysis    Past Surgical History:  Procedure Laterality Date  . No surgical hx     Family History  Problem Relation Age of Onset  . Arthritis Mother   . Diabetes Mother   . Hyperlipidemia Mother   . Hypertension Mother   . Asthma Daughter   . Breast cancer Neg Hx    Social History   Social History  . Marital status: Single    Spouse name: N/A  . Number of children: N/A  .  Years of education: N/A   Occupational History  . Not on file.   Social History Main Topics  . Smoking status: Former Smoker    Quit date: 06/02/2015  . Smokeless tobacco: Never Used  . Alcohol use No  . Drug use: No  . Sexual activity: No   Other Topics Concern  . Not on file   Social History Narrative  . No narrative on file    Interim medical history since last visit reviewed. Allergies and medications reviewed  Review of Systems Per HPI unless specifically indicated above     Objective:    BP 106/74   Pulse 75   Temp 97.8 F (36.6  C)   Resp 14   Wt 268 lb 4.8 oz (121.7 kg)   LMP 06/15/2016   SpO2 98%   BMI 37.42 kg/m   Wt Readings from Last 3 Encounters:  06/17/16 268 lb 4.8 oz (121.7 kg)  04/16/16 250 lb (113.4 kg)  03/16/16 270 lb 6 oz (122.6 kg)    Physical Exam  Constitutional: She appears well-developed and well-nourished.  obese  HENT:  Right Ear: Hearing, tympanic membrane, external ear and ear canal normal.  Left Ear: Hearing, tympanic membrane, external ear and ear canal normal.  Cardiovascular: Normal rate and regular rhythm.   Pulmonary/Chest: Effort normal and breath sounds normal.  Large, pendulous breasts  Abdominal: She exhibits no distension.  Neurological: She is alert. No cranial nerve deficit.  Psychiatric: She has a normal mood and affect.      Assessment & Plan:   Problem List Items Addressed This Visit      Cardiovascular and Mediastinum   Migraine with aura (Chronic)    Start magnesium, start gabapentin if neurologist agrees      Relevant Medications   propranolol ER (INDERAL LA) 60 MG 24 hr capsule   gabapentin (NEURONTIN) 100 MG capsule     Other   Obesity, Class II, BMI 35-39.9    Refer to bariatric clinic      Relevant Orders   Amb Referral to Bariatric Surgery   Chronic lumbar pain    I suspect that this is due to breast hypertrophy and I believe that reduction mammoplasty would help her pain and quality of life; however, cannot get covered so will refer to back doctor      Relevant Orders   Ambulatory referral to Orthopedic Surgery   Breast hypertrophy in female (Chronic)    Unfortunately, Medicaid won't cover reduction mammoplasty          Follow up plan: No Follow-up on file.  An after-visit summary was printed and given to the patient at [place].  Please see the patient instructions which may contain other information and recommendations beyond what is mentioned above in the assessment and plan.  Meds ordered this encounter  Medications  .  propranolol ER (INDERAL LA) 60 MG 24 hr capsule    Sig: Take 60 mg by mouth daily.  Marland Kitchen gabapentin (NEURONTIN) 100 MG capsule    Sig: One by mouth at bedtime x 3 days, then one twice a day    Dispense:  60 capsule    Refill:  0    Orders Placed This Encounter  Procedures  . Ambulatory referral to Orthopedic Surgery  . Amb Referral to Bariatric Surgery

## 2016-08-20 ENCOUNTER — Ambulatory Visit: Attending: Family Medicine | Primary: Family Medicine

## 2016-08-20 DIAGNOSIS — Z6838 Body mass index (BMI) 38.0-38.9, adult: Secondary | ICD-10-CM

## 2016-08-20 DIAGNOSIS — Q135 Blue sclera: Secondary | ICD-10-CM

## 2016-08-20 DIAGNOSIS — D649 Anemia, unspecified: Secondary | ICD-10-CM

## 2016-08-20 DIAGNOSIS — M549 Dorsalgia, unspecified: Secondary | ICD-10-CM

## 2016-08-20 DIAGNOSIS — M1990 Unspecified osteoarthritis, unspecified site: Principal | ICD-10-CM

## 2016-08-20 DIAGNOSIS — M130 Polyarthritis, unspecified: Secondary | ICD-10-CM

## 2016-08-20 DIAGNOSIS — R300 Dysuria: Principal | ICD-10-CM

## 2016-08-20 DIAGNOSIS — Z79899 Other long term (current) drug therapy: Secondary | ICD-10-CM

## 2016-08-20 DIAGNOSIS — G8929 Other chronic pain: Secondary | ICD-10-CM

## 2016-08-20 DIAGNOSIS — R51 Headache: Secondary | ICD-10-CM

## 2016-08-20 MED ORDER — TIZANIDINE HCL 4 MG PO TABS
4 mg | Freq: Four times a day (QID) | ORAL | PRN
Start: 2016-08-20 — End: 2016-09-14

## 2016-08-20 MED ORDER — VITAMIN C 500 MG PO CAPS
ORAL
Start: 2016-08-20 — End: 2016-09-15

## 2016-08-20 MED ORDER — PROPRANOLOL HCL ER 60 MG PO CP24
60 mg | Freq: Every day | ORAL
Start: 2016-08-20 — End: 2016-11-08

## 2016-08-20 MED ORDER — TRAMADOL HCL 50 MG PO TABS
50 mg | Freq: Four times a day (QID) | ORAL | PRN
Start: 2016-08-20 — End: 2016-08-20

## 2016-08-20 MED ORDER — FERROUS SULFATE PO
5 g | ORAL
Start: 2016-08-20 — End: 2016-09-15

## 2016-08-20 MED ORDER — HYOSCYAMINE PO
ORAL_TABLET | ORAL
Start: 2016-08-20 — End: ?

## 2016-08-20 MED ORDER — NYSTATIN 100000 UNIT/GM EX CREA
Freq: Two times a day (BID) | TOPICAL
Start: 2016-08-20 — End: 2016-09-15

## 2016-08-20 MED ORDER — NYSTATIN 100000 UNIT/GM EX POWD
Freq: Two times a day (BID) | TOPICAL
Start: 2016-08-20 — End: 2016-11-09

## 2016-08-20 MED ORDER — TRAMADOL HCL 50 MG PO TABS
50 mg | Freq: Three times a day (TID) | ORAL | 0 refills | Status: CP | PRN
Start: 2016-08-20 — End: 2016-09-15

## 2016-08-20 NOTE — Progress Notes
3. Chronic back pain greater than 3 months duration M54.9 724.5 CBC and Differential [JXLAB]    G89.29 338.29 ANA Titer with Reflex  [JXLAB]      Sedimentation Rate [JXLAB]      Uric Acid [JXLAB]      TSH [JXLAB]      CRP High Sensitive  [JXLAB]      CCP Antibodies IGG/IGA [JXLAB]      Rheumatoid Factor Screen [JXLAB]      CBC and Differential [JXLAB]      ANA Titer with Reflex  [JXLAB]      Sedimentation Rate [JXLAB]      Uric Acid [JXLAB]      TSH [JXLAB]      CRP High Sensitive  [JXLAB]      CCP Antibodies IGG/IGA [JXLAB]      Rheumatoid Factor Screen [JXLAB]      CBC with Differential panel result      traMADol (ULTRAM) 50 MG PO Tablet   4. Polyarthropathy or polyarthritis of multiple sites M13.0 716.59 CBC and Differential [JXLAB]      ANA Titer with Reflex  [JXLAB]      Sedimentation Rate [JXLAB]      Uric Acid [JXLAB]      TSH [JXLAB]      CRP High Sensitive  [JXLAB]      CCP Antibodies IGG/IGA [JXLAB]      Rheumatoid Factor Screen [JXLAB]      CBC and Differential [JXLAB]      ANA Titer with Reflex  [JXLAB]      Sedimentation Rate [JXLAB]      Uric Acid [JXLAB]      TSH [JXLAB]      CRP High Sensitive  [JXLAB]      CCP Antibodies IGG/IGA [JXLAB]      Rheumatoid Factor Screen [JXLAB]      CBC with Differential panel result      traMADol (ULTRAM) 50 MG PO Tablet   5. Anemia, unspecified type D64.9 285.9 CBC and Differential [JXLAB]      CBC and Differential [JXLAB]      CBC with Differential panel result   6. Encounter for long-term (current) drug use Z79.899 V58.69 Drug Screen Comprehensive, Urine      Drug Screen Comprehensive, Urine   7. BMI 38.0-38.9,adult Z68.38 V85.38           Plan:     Orders Placed This Encounter   Medications   ? traMADol (ULTRAM) 50 MG PO Tablet     Sig: Take 1 tablet by mouth every 8 hours as needed for pain.     Dispense:  21 tablet     Refill:  0     Order Specific Question:   Prior to prescribing this controlled

## 2016-08-20 NOTE — Progress Notes
substance, has the [place] PDMP website been accessed by yourself or designated staff?     Answer:   Yes, The PDMP website has been accessed       Will obtain records from previous providers.    Orders Placed This Encounter   Procedures   ? Culture, Urine   ? ANA Titer with Reflex  [JXLAB]   ? Sedimentation Rate [JXLAB]   ? Uric Acid [JXLAB]   ? TSH [JXLAB]   ? CRP High Sensitive  [JXLAB]   ? CCP Antibodies IGG/IGA [JXLAB]   ? Rheumatoid Factor Screen [JXLAB]   ? CBC with Differential panel result   ? Drug Screen Comprehensive, Urine   ? Refer to Ophthalmology   ? POCT Urinalysis w/out microscopy inhouse-AUTO   ? POCT urine pregnancy test-Inhouse       Health Maintenance was reviewed. The patient's HM Topic list was:                                          There are no preventive care reminders to display for this patient.

## 2016-08-20 NOTE — Patient Instructions
Back Pain, Adult  Back pain is very common. The pain often gets better over time. The cause of back pain is usually not dangerous. Most people can learn to manage their back pain on their own.  Follow these instructions at home:  Watch your back pain for any changes. The following actions may help to lessen any pain you are feeling:  ? Stay active. Start with short walks on flat ground if you can. Try to walk farther each day.  ? Exercise regularly as told by your doctor. Exercise helps your back heal faster. It also helps avoid future injury by keeping your muscles strong and flexible.  ? Do not sit, drive, or stand in one place for more than 30 minutes.  ? Do not stay in bed. Resting more than 1?2 days can slow down your recovery.  ? Be careful when you bend or lift an object. Use good form when lifting:  ? Bend at your knees.  ? Keep the object close to your body.  ? Do not twist.  ? Sleep on a firm mattress. Lie on your side, and bend your knees. If you lie on your back, put a pillow under your knees.  ? Take medicines only as told by your doctor.  ? Put ice on the injured area.  ? Put ice in a plastic bag.  ? Place a towel between your skin and the bag.  ? Leave the ice on for 20 minutes, 2?3 times a day for the first 2?3 days. After that, you can switch between ice and heat packs.  ? Avoid feeling anxious or stressed. Find good ways to deal with stress, such as exercise.  ? Maintain a healthy weight. Extra weight puts stress on your back.  Contact a doctor if:  ? You have pain that does not go away with rest or medicine.  ? You have worsening pain that goes down into your legs or buttocks.  ? You have pain that does not get better in one week.  ? You have pain at night.  ? You lose weight.  ? You have a fever or chills.  Get help right away if:  ? You cannot control when you poop (bowel movement) or pee (urinate).  ? Your arms or legs feel weak.  ? Your arms or legs lose feeling (numbness).

## 2016-08-20 NOTE — Progress Notes
Subjective:   Monica Snyder is a 31 y.o. female being seen today for Migraine and Back Pain       Pt is here to establish and has multiple health concerns.      Noted on exam that pt has blue sclera, pt stated that she has had this in the past, and nothing was looked into for this.  When mentioning her eyes, the pt mentioned that her vision has been getting worse as well.    The pt also has had recurrent headaches.  Discussion followed.  The pt states that her headaches and back pain has been ongoing and that she had been sent to a rheumatologist in [place].  The pt didn't have her old records, but was able to access some and I was able to view them on her patient portal.      The pt has also had a hx of anemia, and states that she recently had labs for this in [place] as well, but doesn't have the results.    The pt also has a concern about a UTI with her pain on urination. The pt states that this has been for a few days and is getting worse.      Back Pain   This is a chronic problem. The current episode started more than 1 year ago. The problem occurs constantly. The problem has been waxing and waning since onset. The pain is present in the lumbar spine. The quality of the pain is described as aching and cramping. The pain does not radiate. The pain is at a severity of 6/10. The pain is moderate. The pain is worse during the day. The symptoms are aggravated by position, lying down, sitting, stress, twisting and standing. Associated symptoms include abdominal pain, headaches and numbness. Pertinent negatives include no bladder incontinence, bowel incontinence, dysuria, fever, pelvic pain, perianal numbness, weakness or weight loss. Risk factors include sedentary lifestyle and obesity. She has tried home exercises, heat, NSAIDs and walking for the symptoms. The treatment provided mild relief.       Past Medical History:   Diagnosis Date   ? Anemia    ? Arthritis    ? Frequent headaches

## 2016-08-20 NOTE — Progress Notes
History reviewed. No pertinent surgical history.  Family History   Problem Relation Age of Onset   ? Asthma Other    ? Diabetes Other    ? High Blood Pressure Other    ? Arthritis Other    ? Ovarian Cancer Other      Social History     Social History   ? Marital status: Single     Spouse name: N/A   ? Number of children: N/A   ? Years of education: N/A     Occupational History   ? Not on file.     Social History Main Topics   ? Smoking status: Former Smoker   ? Smokeless tobacco: Never Used      Comment: pt states quit 7/10   ? Alcohol use No   ? Drug use: No   ? Sexual activity: No     Other Topics Concern   ? Not on file     Social History Narrative   ? No narrative on file     No current outpatient prescriptions on file prior to visit.     No current facility-administered medications on file prior to visit.      Allergies   Allergen Reactions   ? Cherry Other (See Comments)     Pt did not specify   ? Ginger Other (See Comments)     Pt did not specify   ? Naproxen Other (See Comments)     Pt did not specify   ? Prednisone Other (See Comments)     Pt did not specify         Review of Systems  Review of Systems   Constitutional: Positive for fatigue. Negative for chills, diaphoresis, fever and weight loss.   HENT: Negative.  Negative for congestion, nosebleeds and rhinorrhea.    Eyes: Positive for visual disturbance. Negative for pain, redness and itching.   Respiratory: Negative.  Negative for cough, chest tightness, shortness of breath and wheezing.    Cardiovascular: Negative.  Negative for palpitations and leg swelling.   Gastrointestinal: Positive for abdominal pain. Negative for blood in stool, bowel incontinence and constipation.   Endocrine: Negative.  Negative for polydipsia and polyphagia.   Genitourinary: Negative for bladder incontinence, dysuria, flank pain and pelvic pain.   Musculoskeletal: Positive for arthralgias, back pain and myalgias. Negative for gait problem and joint swelling.

## 2016-08-20 NOTE — Progress Notes
Skin: Negative.  Negative for rash.   Neurological: Positive for numbness and headaches. Negative for syncope and weakness.   Hematological: Negative.  Negative for adenopathy.   Psychiatric/Behavioral: Negative.  Negative for hallucinations, sleep disturbance and suicidal ideas.   All other systems reviewed and are negative.          Objective:        VITAL SIGNS (all recorded)      Clinic Vitals     Row Name 08/20/16 0935                Amb Encounter Vitals    Weight 122.2 kg (269 lb 6.4 oz)    -HS at 08/20/16 0953       Height 1.775 m (5' 9.88")    -HS at 08/20/16 0953       BMI (Calculated) 38.87    -HS at 08/20/16 0953       BSA (Calculated - sq m) 2.45    -HS at 08/20/16 0953       BP 114/59    -HS at 08/20/16 0953       BP Location Left upper arm    -HS at 08/20/16 0953       Position Sitting    -SK at 08/20/16 0935       Pulse 63    -HS at 08/20/16 0953       Pulse Source Monitor    -SK at 08/20/16 0935       Pulse Quality Normal    -HS at 08/20/16 0953       Resp 16    -HS at 08/20/16 0953       Respiration Quality Normal    -HS at 08/20/16 0953       Temp 36.1 ?C (97 ?F)    -HS at 08/20/16 0953       Temperature Source Oral    -SK at 08/20/16 0935       O2 Saturation 98 %    -HS at 08/20/16 0953       Pain Score EIGHT    -HS at 08/20/16 0953       Last Menstrual Period 08/09/16    -SK at 08/20/16 0935          Education/Communication Barriers?    Learning/Communication Barriers? No    -SK at 08/20/16 0935          Fall Risk Assessment    Had recent fall / Last 6 months? No recent fall    -SK at 08/20/16 [id number]       Does patient have a fear of falling? No    -SK at 08/20/16 0935         User Key  (r) = Recorded By, (t) = Taken By, (c) = Cosigned By    Initials Name Effective Dates    HS Katrinka BlazingSmith, [place], [place] 05/06/15 -     Lyda JesterSK Ketchie, Sara, MA 05/06/15 -         Physical Exam   Constitutional: She is oriented to person, place, and time. She appears well-developed and well-nourished. No distress.   HENT:

## 2016-08-20 NOTE — Progress Notes
Head: Normocephalic and atraumatic.   Right Ear: External ear normal.   Left Ear: External ear normal.   Nose: Nose normal.   Mouth/Throat: Oropharynx is clear and moist. No oropharyngeal exudate.   Eyes: Pupils are equal, round, and reactive to light. Conjunctivae and EOM are normal. Right eye exhibits no discharge. Left eye exhibits no discharge.   Bilateral blue sclera   Neck: Normal range of motion. Neck supple. No tracheal deviation present. No thyromegaly present.   Cardiovascular: Normal rate, regular rhythm, normal heart sounds and intact distal pulses.  Exam reveals no gallop and no friction rub.    No murmur heard.  Pulmonary/Chest: Effort normal and breath sounds normal. No respiratory distress. She has no wheezes. She has no rales.   Abdominal: Soft. Bowel sounds are normal. She exhibits no distension. There is no tenderness. There is CVA tenderness. There is no rebound, no guarding, no tenderness at McBurney's point and negative Murphy's sign.   Musculoskeletal: She exhibits no edema or deformity.        Cervical back: She exhibits spasm.        Thoracic back: She exhibits spasm.        Lumbar back: She exhibits spasm.   Lymphadenopathy:     She has no cervical adenopathy.   Neurological: She is alert and oriented to person, place, and time. She has normal reflexes. No cranial nerve deficit. Coordination normal.   Skin: Skin is warm and dry. No rash noted. She is not diaphoretic. No erythema. No pallor.   Psychiatric: She has a normal mood and affect. Her behavior is normal. Judgment and thought content normal.   Nursing note and vitals reviewed.       Assessment:       ICD-10-CM ICD-9-CM    1. Dysuria R30.0 788.1 POCT Urinalysis w/out microscopy inhouse-AUTO      POCT urine pregnancy test-Inhouse      Culture, Urine      Culture, Urine      CANCELED: POCT Urinalysis w/out microscopy inhouse-AUTO   2. Abnormal blue sclerae Q13.5 743.47 Refer to Ophthalmology

## 2016-08-20 NOTE — Patient Instructions
?   You feel sick to your stomach (nauseous) or throw up (vomit).  ? You have belly (abdominal) pain.  ? You feel like you may pass out (faint).  This information is not intended to replace advice given to you by your health care provider. Make sure you discuss any questions you have with your health care provider.  Document Released: 07/07/2007 Document Revised: 06/26/2015 Document Reviewed: 05/22/2013  Elsevier Interactive Patient Education ? 2018 Elsevier Inc.

## 2016-08-23 DIAGNOSIS — R202 Paresthesia of skin: Secondary | ICD-10-CM

## 2016-08-23 DIAGNOSIS — R51 Headache: Principal | ICD-10-CM

## 2016-08-23 DIAGNOSIS — M545 Low back pain: Secondary | ICD-10-CM

## 2016-09-14 DIAGNOSIS — M62838 Other muscle spasm: Principal | ICD-10-CM

## 2016-09-14 MED ORDER — TIZANIDINE HCL 4 MG PO TABS
4 mg | Freq: Three times a day (TID) | ORAL | 1 refills | Status: CP | PRN
Start: 2016-09-14 — End: 2017-04-08

## 2016-09-14 NOTE — Telephone Encounter
Pcp [facility]  [facility]       Pt is also needing the iron tablets epic would not allow the request     Pt is using the [facility]     Please call when sent     Thank you

## 2016-09-14 NOTE — Telephone Encounter
Pcp [facility]  [facility]       Pt is also needing the iron tablets and vitamin C epic would not allow the request     Pt is using the [facility]     Please call when sent     Thank you

## 2016-09-15 ENCOUNTER — Ambulatory Visit: Attending: Family Medicine | Primary: Family Medicine

## 2016-09-15 DIAGNOSIS — D649 Anemia, unspecified: Secondary | ICD-10-CM

## 2016-09-15 DIAGNOSIS — M1990 Unspecified osteoarthritis, unspecified site: Principal | ICD-10-CM

## 2016-09-15 DIAGNOSIS — Z6839 Body mass index (BMI) 39.0-39.9, adult: Secondary | ICD-10-CM

## 2016-09-15 DIAGNOSIS — M545 Low back pain: Secondary | ICD-10-CM

## 2016-09-15 DIAGNOSIS — Q135 Blue sclera: Secondary | ICD-10-CM

## 2016-09-15 DIAGNOSIS — R5382 Chronic fatigue, unspecified: Secondary | ICD-10-CM

## 2016-09-15 DIAGNOSIS — R51 Headache: Secondary | ICD-10-CM

## 2016-09-15 DIAGNOSIS — D509 Iron deficiency anemia, unspecified: Principal | ICD-10-CM

## 2016-09-15 MED ORDER — VITAMIN C 500 MG PO CAPS
1 | Freq: Two times a day (BID) | ORAL | 3 refills | Status: CP
Start: 2016-09-15 — End: 2016-11-08

## 2016-09-15 MED ORDER — TRIAMCINOLONE ACETONIDE 0.1 % EX CREA: Start: 2016-09-15 — End: 2017-07-07

## 2016-09-15 MED ORDER — FERROUS SULFATE 325 (65 FE) MG PO TBEC
325 mg | Freq: Two times a day (BID) | ORAL | 3 refills | Status: CP
Start: 2016-09-15 — End: 2017-04-08

## 2016-09-15 MED ORDER — HYDROCODONE-ACETAMINOPHEN 5-325 MG PO TABS
1 | ORAL_TABLET | Freq: Three times a day (TID) | ORAL | 0 refills | Status: CP | PRN
Start: 2016-09-15 — End: 2016-12-29

## 2016-09-15 MED ORDER — HYDROCODONE-ACETAMINOPHEN 5-325 MG PO TABS
1 | ORAL_TABLET | Freq: Four times a day (QID) | ORAL | PRN
Start: 2016-09-15 — End: 2016-09-15

## 2016-09-15 MED ORDER — TOPIRAMATE 25 MG PO TABS
25 mg | Freq: Two times a day (BID) | ORAL
Start: 2016-09-15 — End: 2016-11-08

## 2016-09-15 MED ORDER — GABAPENTIN 100 MG PO CAPS
100 mg | ORAL | PRN
Start: 2016-09-15 — End: 2017-01-19

## 2016-09-15 NOTE — Addendum Note
Addended by: Mickey FarberSTERNETT, CARA on: 09/15/2016 03:33 PM     Modules accepted: Orders

## 2016-09-17 NOTE — Patient Instructions
Iron Deficiency Anemia  Iron deficiency anemia is a decrease in the number of red blood cells caused by too little iron. Without enough iron, your body does not produce enough hemoglobin. Hemoglobin is a substance in red blood cells that carries oxygen to the body's tissues. Iron deficiency anemia may leave you tired and short of breath.  CAUSES  ? Lack of iron in the diet.   ? This may be seen in infants and children, because there is little iron in milk.   ? This may be seen in adults who do not eat red meat or other foods high in iron.   ? This may be seen in pregnancy, because there is a much higher need for iron intake.   ? Lack of ability to absorb iron in the small intestines.   ? Intestinal bleeding.   ? Heavy periods.   It is important to find the cause of the anemia. Testing for blood in the stool and colon exams are often helpful.  HOME CARE INSTRUCTIONS  ? Most iron deficiency is treated with oral iron supplements taken 2 to 3 times daily. Iron pills should be taken until blood tests show the hemoglobin level is normal.   ? Iron supplements may upset your stomach. Taking them with food will decrease stomach upset. They can also cause constipation and dark stools.   ? Iron supplements should be taken with a juice high in vitamin C (orange juice, tomato juice). This increases their absorption.   ? You may also increase your dietary iron by eating:   ? Lean red meat, especially liver.   ? Seafood.   ? Poultry.   ? Wheat germ, whole-grain breads and cereals, peas, and lentils.   ? Dried fruits, such as raisins and apricots.   ? Dark green, leafy vegetables, such as spinach.   More serious cases of iron deficiency anemia may require a faster method of increasing iron, such as a blood transfusion. See your caregiver for follow-up care as suggested.  Document Released: 02/26/2004 Document Re-Released: 07/08/2009  [employer]Care? Patient Information ?2011 [employer].

## 2016-09-22 ENCOUNTER — Inpatient Hospital Stay: Admit: 2016-09-22 | Discharge: 2016-09-23 | Primary: Family Medicine

## 2016-09-22 DIAGNOSIS — M545 Low back pain: Principal | ICD-10-CM

## 2016-09-22 DIAGNOSIS — R202 Paresthesia of skin: Secondary | ICD-10-CM

## 2016-09-22 DIAGNOSIS — M47812 Spondylosis without myelopathy or radiculopathy, cervical region: Secondary | ICD-10-CM

## 2016-09-22 DIAGNOSIS — R51 Headache: Secondary | ICD-10-CM

## 2016-11-05 ENCOUNTER — Encounter: Attending: Ophthalmology | Primary: Family Medicine

## 2016-11-08 ENCOUNTER — Ambulatory Visit: Attending: Nurse Practitioner | Primary: Family Medicine

## 2016-11-08 DIAGNOSIS — N62 Hypertrophy of breast: Secondary | ICD-10-CM

## 2016-11-08 DIAGNOSIS — G43009 Migraine without aura, not intractable, without status migrainosus: Secondary | ICD-10-CM

## 2016-11-08 DIAGNOSIS — N898 Other specified noninflammatory disorders of vagina: Principal | ICD-10-CM

## 2016-11-08 DIAGNOSIS — M545 Low back pain: Secondary | ICD-10-CM

## 2016-11-08 DIAGNOSIS — R238 Other skin changes: Secondary | ICD-10-CM

## 2016-11-08 DIAGNOSIS — R51 Headache: Secondary | ICD-10-CM

## 2016-11-08 DIAGNOSIS — T148XXA Other injury of unspecified body region, initial encounter: Secondary | ICD-10-CM

## 2016-11-08 DIAGNOSIS — M1990 Unspecified osteoarthritis, unspecified site: Principal | ICD-10-CM

## 2016-11-08 DIAGNOSIS — L732 Hidradenitis suppurativa: Secondary | ICD-10-CM

## 2016-11-08 DIAGNOSIS — G8929 Other chronic pain: Secondary | ICD-10-CM

## 2016-11-08 DIAGNOSIS — N644 Mastodynia: Secondary | ICD-10-CM

## 2016-11-08 DIAGNOSIS — N63 Unspecified lump in unspecified breast: Secondary | ICD-10-CM

## 2016-11-08 DIAGNOSIS — B379 Candidiasis, unspecified: Secondary | ICD-10-CM

## 2016-11-08 DIAGNOSIS — D649 Anemia, unspecified: Secondary | ICD-10-CM

## 2016-11-08 MED ORDER — FLUCONAZOLE 150 MG PO TABS
150 mg | Freq: Every day | ORAL | 0 refills | Status: CP
Start: 2016-11-08 — End: 2016-11-20

## 2016-11-08 MED ORDER — MUPIROCIN 2 % EX OINT
0 refills | Status: CP
Start: 2016-11-08 — End: 2017-01-21

## 2016-11-08 MED ORDER — AMITRIPTYLINE HCL 25 MG PO TABS
25 mg | Freq: Every evening | ORAL | 1 refills | Status: CP
Start: 2016-11-08 — End: 2017-01-19

## 2016-11-08 MED ORDER — NYSTATIN 100000 UNIT/GM EX POWD
2 refills | Status: CP
Start: 2016-11-08 — End: 2017-08-05

## 2016-11-08 NOTE — Progress Notes
Subjective:   Monica Snyder is a 31 y.o. female being seen today for Vaginal Discharge and Breast Pain       HPI   -Pt presents to clinic today for discussion of vaginal discharge and bilateral breast pain/sores along with rash under bilateral breasts. Pt reports same issues with breasts in different state and had mammo performed-reports she was supposed to return in 6 months for reimaging but, did not. Pt reports she has bilateral pain and lump under R axilla. Discussion followed  -PT with rash under bilat breasts-states she has been using nystatin powder with no improvement. PT with very large breasts causing indentations to bilateral shoulders and chronic back pain. Pt current size of 54G and LL in other brands. Discussion followed  -PT reports continued migraines-pt is currently taking topamax with no improvement. Pt would like to discuss options. Discussion followed  -PT current weight/BMI=267/38  Monica Snyder's Estimated body mass index is 38.47 kg/m? as calculated from the following:    Height as of this encounter: 1.775 m (5' 9.88").    Weight as of this encounter: 121.2 kg (267 lb 3.2 oz).      The health risks associated with an elevated BMI were discussed and education was provided.  See orders for any further follow up plans.    -Pt former smoker    -PT denies any other acute complaints    Past Medical History:   Diagnosis Date   ? Anemia    ? Arthritis    ? Frequent headaches      History reviewed. No pertinent surgical history.  Family History   Problem Relation Age of Onset   ? Asthma Other    ? Diabetes Other    ? High Blood Pressure Other    ? Arthritis Other    ? Ovarian Cancer Other      Social History     Social History   ? Marital status: Single     Spouse name: N/A   ? Number of children: N/A   ? Years of education: N/A     Occupational History   ? Not on file.     Social History Main Topics   ? Smoking status: Former Smoker   ? Smokeless tobacco: Never Used      Comment: pt states quit 7/10

## 2016-11-08 NOTE — Progress Notes
-  SK at 11/08/16 1423       BSA (Calculated - sq m) 2.44    -SK at 11/08/16 1423       BP 107/76    -SK at 11/08/16 1423       BP Location Left upper arm    -SK at 11/08/16 1423       Position Sitting    -SK at 11/08/16 1423       Pulse 75    -SK at 11/08/16 1423       Pulse Source Monitor    -SK at 11/08/16 1423       Pulse Quality Normal    -SK at 11/08/16 1423       Resp 16    -SK at 11/08/16 1423       Respiration Quality Normal    -SK at 11/08/16 1423       Temp 37 ?C (98.6 ?F)    -SK at 11/08/16 1423       Temperature Source Oral    -SK at 11/08/16 1423       O2 Saturation 100 %    -SK at 11/08/16 1423       Pain Score Five    -SK at 11/08/16 1423       Location PT STATES LEGS, BREAST, AND HEAD    -SK at 11/08/16 1423       Last Menstrual Period 10/27/16    -SK at 11/08/16 1423          Education/Communication Barriers?    Learning/Communication Barriers? No    -SK at 11/08/16 1423          Fall Risk Assessment    Had recent fall / Last 6 months? No recent fall    -SK at 11/08/16 1423       Does patient have a fear of falling? No    -SK at 11/08/16 1423         User Key  (r) = Recorded By, (t) = Taken By, (c) = Cosigned By    Initials Name Effective Dates    SK Renard Matter, MA 05/06/15 -         Physical Exam   Constitutional: She appears well-developed and well-nourished. No distress.   HENT:   Head: Normocephalic and atraumatic.   Nose: Nose normal.   Eyes: Conjunctivae and EOM are normal. Right eye exhibits no discharge. Left eye exhibits no discharge.   Neck: Normal range of motion. No tracheal deviation present.   Cardiovascular: Normal rate.    Pulmonary/Chest: Effort normal. No respiratory distress. Right breast exhibits tenderness. Left breast exhibits tenderness.   Abdominal: Soft.   Genitourinary: Pelvic exam was performed with patient supine. Vaginal discharge found.   Genitourinary Comments: Chaperone: Dillon Bjork, MA   Musculoskeletal: She exhibits no edema or deformity.   LEs  Normal gait

## 2016-11-08 NOTE — Progress Notes
Skin: Rash noted. There is erythema.   Candida rash noted under breasts bilaterally   Psychiatric: She has a normal mood and affect. Her behavior is normal. Judgment and thought content normal.   Vitals reviewed.       Assessment:       ICD-10-CM ICD-9-CM    1. Pain of both breasts N64.4 611.71 MAM Diagnostic Bilateral      US Breast Bilateral Limited   2. Vaginal discharge N89.8 623.5 Candida Vaginitis Panel by Real-Time PCR      Bacterial Vaginosis Panel (with Lactobacillus Profiling) by Quantitative Real-Time PCR      Ureaplasma urealyticum by Real-Time PCR      Candida Vaginitis Panel by Real-Time PCR      Bacterial Vaginosis Panel (with Lactobacillus Profiling) by Quantitative Real-Time PCR      Ureaplasma urealyticum by Real-Time PCR   3. Candida infection B37.9 112.9 fluconazole (DIFLUCAN) 150 MG PO Tablet      nystatin (MYCOSTATIN) 100000 UNIT/GM EX Powder   4. Open wound T14.8XXA 879.8    5. Hydradenitis L73.2 705.83 mupirocin (BACTROBAN) 2 % EX Ointment   6. Migraine without aura, not intractable, without status migrainosus G43.009 346.10 amitriptyline (ELAVIL) 25 MG PO Tablet   7. Breast lump N63.0 611.72 MAM Diagnostic Bilateral      US Breast Bilateral Limited   8. Indentation of skin due to clothing R23.8 782.9 Refer to Plastic Surgery   9. Large breasts N62 611.1 Refer to Plastic Surgery   10. Chronic midline low back pain without sciatica M54.5 724.2 Refer to Plastic Surgery    G89.29 338.29           Plan:     Orders Placed This Encounter   Medications   ? fluconazole (DIFLUCAN) 150 MG PO Tablet     Sig: Take 1 tablet by mouth daily for 3 days.     Dispense:  3 tablet     Refill:  0   ? nystatin (MYCOSTATIN) 100000 UNIT/GM EX Powder     Sig: Apply daily as needed     Dispense:  60 g     Refill:  2   ? mupirocin (BACTROBAN) 2 % EX Ointment     Sig: Apply topically 3 times daily to breast sores.     Dispense:  30 g     Refill:  0   ? amitriptyline (ELAVIL) 25 MG PO Tablet

## 2016-11-08 NOTE — Progress Notes
Sig: Take 1 tablet by mouth nightly at bedtime.     Dispense:  30 tablet     Refill:  1     Orders Placed This Encounter   Procedures   ? Candida Vaginitis Panel by Real-Time PCR   ? Bacterial Vaginosis Panel (with Lactobacillus Profiling) by Quantitative Real-Time PCR   ? Ureaplasma urealyticum by Real-Time PCR   ? MAM Diagnostic Bilateral   ? US Breast Bilateral Limited   ? Refer to Plastic Surgery     Referred to plastics due to large breasts  -rx as above for candida-advised to use powder preventatively  -mammo and [place] ordered bilat  -rx for breast sores-may need referral to derm due to tunneling if reduction does not occur  -rx started as above for migraine prevention-f/u after  -cervicitis panel and will call with results    Health Maintenance was reviewed. The patient's HM Topic list was:                                            Health Maintenance   Topic Date Due   ? Varicella Vaccine (1 of 2 - 2-dose adolescent series) 01/19/1997   ? USPSTF HIV Risk Assessment  01/20/1999   ? Preventive Wellness Visit  01/19/2002   ? DTaP,Tdap,and Td Vaccines (1 - Tdap) 01/20/2003   ? Pap Smear  01/19/2005   ? Influenza Vaccine (1) 10/02/2016

## 2016-11-08 NOTE — Progress Notes
?   Alcohol use No   ? Drug use: No   ? Sexual activity: No     Other Topics Concern   ? Not on file     Social History Narrative   ? No narrative on file     Current Outpatient Prescriptions on File Prior to Visit   Medication Sig   ? ferrous sulfate 325 (65 Fe) MG PO Tablet Delayed Release Take 1 tablet by mouth 2 times daily.   ? gabapentin (NEURONTIN) 100 MG PO Capsule Take 100 mg by mouth as needed.    ? Hyoscyamine Sulfate (HYOSCYAMINE PO) by mouth.   ? [DISCONTINUED] nystatin (MYCOSTATIN) 100000 UNIT/GM EX Powder Apply topically 2 times daily.   ? tiZANidine (ZANAFLEX) 4 MG PO Tablet Take 1 tablet by mouth every 8 hours as needed.   ? triamcinolone (KENALOG) 0.1 % EX Cream    ? HYDROcodone-acetaminophen (NORCO) 5-325 MG PO Tablet Take 1 tablet by mouth every 8 hours as needed.   ? [DISCONTINUED] propranolol (INDERAL LA) 60 MG PO Capsule Extended Release 24 Hour Take 60 mg by mouth daily.   ? [DISCONTINUED] topiramate (TOPAMAX) 25 MG PO Tablet Take 25 mg by mouth 2 times daily.    ? [DISCONTINUED] Vitamin C 500 MG PO Capsule Take 1 caplet by mouth 2 times daily.     No current facility-administered medications on file prior to visit.      Allergies   Allergen Reactions   ? Cherry Other (See Comments)     Pt did not specify   ? Ginger Other (See Comments)     Pt did not specify   ? Naproxen Other (See Comments)     Pt did not specify   ? Prednisone Other (See Comments)     Pt did not specify     Review of Systems  Review of Systems   Constitutional: Positive for fatigue.   Genitourinary: Positive for vaginal discharge.   Skin: Positive for rash.   Neurological: Positive for headaches.   All other systems reviewed and are negative.      Objective:        VITAL SIGNS (all recorded)      Clinic Vitals     Row Name 11/08/16 1422                Amb Encounter Vitals    Weight 121.2 kg (267 lb 3.2 oz)    -SK at 11/08/16 1423       Height 1.775 m (5' 9.88")    -SK at 11/08/16 1423       BMI (Calculated) 38.55

## 2016-11-09 NOTE — Progress Notes
I agree with the plan and assessment as above, no teaching statement needed.

## 2016-11-16 DIAGNOSIS — B379 Candidiasis, unspecified: Principal | ICD-10-CM

## 2016-11-16 MED ORDER — FLUCONAZOLE 150 MG PO TABS
150 mg | Freq: Every day | ORAL | 0 refills
Start: 2016-11-16 — End: ?

## 2016-11-16 NOTE — Telephone Encounter
From: Monica PoliceAmanda Snyder  Sent: 11/15/2016 6:25 PM EDT  Subject: Medication Renewal Request    Monica Snyder would like a refill of the following medications:     fluconazole (DIFLUCAN) 150 MG PO Tablet Raquel James[Sheri L Hayes, ARNP]    Pref[facility] pharmacy: [facility] - [place], [place] - [address]

## 2016-11-16 NOTE — Telephone Encounter
Declined  Pt was treated previously

## 2016-11-19 ENCOUNTER — Ambulatory Visit: Attending: Ophthalmology | Primary: Family Medicine

## 2016-11-19 DIAGNOSIS — M1990 Unspecified osteoarthritis, unspecified site: Principal | ICD-10-CM

## 2016-11-19 DIAGNOSIS — H538 Other visual disturbances: Secondary | ICD-10-CM

## 2016-11-19 DIAGNOSIS — R51 Headache: Secondary | ICD-10-CM

## 2016-11-19 DIAGNOSIS — B379 Candidiasis, unspecified: Principal | ICD-10-CM

## 2016-11-19 DIAGNOSIS — G43909 Migraine, unspecified, not intractable, without status migrainosus: Secondary | ICD-10-CM

## 2016-11-19 DIAGNOSIS — H04123 Dry eye syndrome of bilateral lacrimal glands: Principal | ICD-10-CM

## 2016-11-19 DIAGNOSIS — D649 Anemia, unspecified: Secondary | ICD-10-CM

## 2016-11-19 MED ORDER — FLUCONAZOLE 150 MG PO TABS
150 mg | Freq: Every day | ORAL | 2 refills | Status: CP
Start: 2016-11-19 — End: 2017-01-19

## 2016-11-19 NOTE — Progress Notes
Thin sclera 360 in Both eyes   No histoy of inflammation in Both eyes   observe     Dry eyes in Both eyes   artificial tears 4-6 times a day     Blurry vision due to  refractive error   patient will  see Dr. Dinah BeersLa Rosa     Return to clinic in 1 year or sooner if any problems arise

## 2016-11-22 ENCOUNTER — Encounter: Primary: Family Medicine

## 2016-11-29 ENCOUNTER — Encounter: Primary: Family Medicine

## 2016-12-14 ENCOUNTER — Encounter: Attending: Nurse Practitioner | Primary: Family Medicine

## 2016-12-29 ENCOUNTER — Ambulatory Visit: Attending: Family Medicine | Primary: Family Medicine

## 2016-12-29 DIAGNOSIS — R51 Headache: Secondary | ICD-10-CM

## 2016-12-29 DIAGNOSIS — M25562 Pain in left knee: Principal | ICD-10-CM

## 2016-12-29 DIAGNOSIS — D649 Anemia, unspecified: Secondary | ICD-10-CM

## 2016-12-29 DIAGNOSIS — M1990 Unspecified osteoarthritis, unspecified site: Principal | ICD-10-CM

## 2016-12-29 DIAGNOSIS — E6609 Other obesity due to excess calories: Secondary | ICD-10-CM

## 2016-12-29 DIAGNOSIS — M545 Low back pain: Secondary | ICD-10-CM

## 2016-12-29 DIAGNOSIS — Z6839 Body mass index (BMI) 39.0-39.9, adult: Secondary | ICD-10-CM

## 2016-12-29 DIAGNOSIS — S8002XA Contusion of left knee, initial encounter: Secondary | ICD-10-CM

## 2016-12-29 DIAGNOSIS — Z79899 Other long term (current) drug therapy: Secondary | ICD-10-CM

## 2016-12-29 DIAGNOSIS — G43909 Migraine, unspecified, not intractable, without status migrainosus: Secondary | ICD-10-CM

## 2016-12-29 MED ORDER — HYDROCODONE-ACETAMINOPHEN 5-325 MG PO TABS
1 | ORAL_TABLET | Freq: Three times a day (TID) | ORAL | 0 refills | Status: CP | PRN
Start: 2016-12-29 — End: 2017-07-07

## 2016-12-29 MED ORDER — FLUCONAZOLE 150 MG PO TABS: Start: 2016-12-29 — End: 2016-12-29

## 2016-12-31 NOTE — Patient Instructions
Knee Pain, Adult  Many things can cause knee pain. The pain often goes away on its own with time and rest. If the pain does not go away, tests may be done to find out what is causing the pain.  Follow these instructions at home:  Activity  ? Rest your knee.  ? Do not do things that cause pain.  ? Avoid activities where both feet leave the ground at the same time (high-impact activities). Examples are running, jumping rope, and doing jumping jacks.  General instructions  ? Take medicines only as told by your doctor.  ? Raise (elevate) your knee when you are resting. Make sure your knee is higher than your heart.  ? Sleep with a pillow under your knee.  ? If told, put ice on the knee:  ? Put ice in a plastic bag.  ? Place a towel between your skin and the bag.  ? Leave the ice on for 20 minutes, 2?3 times a day.  ? Ask your doctor if you should wear an elastic knee support.  ? Lose weight if you are overweight. Being overweight can make your knee hurt more.  ? Do not use any tobacco products. These include cigarettes, chewing tobacco, or electronic cigarettes. If you need help quitting, ask your doctor. Smoking may slow down healing.  Contact a doctor if:  ? The pain does not stop.  ? The pain changes or gets worse.  ? You have a fever along with knee pain.  ? Your knee gives out or locks up.  ? Your knee swells, and becomes worse.  Get help right away if:  ? Your knee feels warm.  ? You cannot move your knee.  ? You have very bad knee pain.  ? You have chest pain.  ? You have trouble breathing.  Summary  ? Many things can cause knee pain. The pain often goes away on its own with time and rest.  ? Avoid activities that put stress on your knee. These include running and jumping rope.  ? Get help right away if you cannot move your knee, or if your knee feels warm, or if you have trouble breathing.  This information is not intended to replace advice given to you by your

## 2016-12-31 NOTE — Patient Instructions
health care provider. Make sure you discuss any questions you have with your health care provider.  Document Released: 04/16/2008 Document Revised: 01/13/2016 Document Reviewed: 01/13/2016  Elsevier Interactive Patient Education ? 2017 Elsevier Inc[employer]

## 2017-01-07 NOTE — Progress Notes
Appropriate UDS

## 2017-01-11 ENCOUNTER — Inpatient Hospital Stay: Admit: 2017-01-11 | Discharge: 2017-01-12 | Primary: Family Medicine

## 2017-01-11 DIAGNOSIS — S8002XD Contusion of left knee, subsequent encounter: Principal | ICD-10-CM

## 2017-01-11 DIAGNOSIS — M25462 Effusion, left knee: Secondary | ICD-10-CM

## 2017-01-11 DIAGNOSIS — S8002XA Contusion of left knee, initial encounter: Secondary | ICD-10-CM

## 2017-01-11 DIAGNOSIS — M94262 Chondromalacia, left knee: Secondary | ICD-10-CM

## 2017-01-11 DIAGNOSIS — M25562 Pain in left knee: Secondary | ICD-10-CM

## 2017-01-12 ENCOUNTER — Encounter: Attending: Nurse Practitioner | Primary: Family Medicine

## 2017-01-12 NOTE — Progress Notes
From: Nicola PoliceAmanda Valvo  To: Raquel JamesHayes, Sheri L, ARNP  Sent: 01/11/2017 5:36 PM EST  Subject: Previous Questionnaire Submission    This message was automatically generated when an appointment dated 01/12/2017 was rescheduled to 01/19/2017.    The appointment contained the following questionnaire data, which was not moved to the new appointment:    Patient Questionnaire Submission  --------------------------------    Questionnaire: Social Needs Screening Tool    ~~~~~~~~~~~~~~~~~~~~~~~~~~~~~~~~  FINANCES   Question: How hard is it for you to pay for the very basics like food, housing, medical care, and heating?   Answer: Decline to answer        ~~~~~~~~~~~~~~~~~~~~~~~~~~~~~~~~  HOUSING   Question: What is your housing situation today?   Answer: I do not have housing        ~~~~~~~~~~~~~~~~~~~~~~~~~~~~~~~~  FOOD   Question: Within the past 12 months, you worried that your food would run out before you got money to buy more.   Answer: Decline to answer     Question: Within the past 12 months, the food you bought just didn?t last and you didn?t have money to get more.   Answer: Decline to answer        ~~~~~~~~~~~~~~~~~~~~~~~~~~~~~~~~  TRANSPORTATION   Question: In the past 12 months, has lack of transportation kept you from medical appointments or from getting medications?   Answer: No     Question: In the past 12 months, has lack of transportation kept you from meetings, work, or getting things needed for daily living?   Answer: No        ~~~~~~~~~~~~~~~~~~~~~~~~~~~~~~~~  PHYSICAL ACTIVITY    Question: On average, how many days per week do you engage in moderate to strenuous exercise (like walking fast, running, jogging, dancing, swimming, biking, or other activites that cause a light or heavy sweat)?   Answer: Decline to answer     Question: On average, how many minutes do you engage in exercise at this level?   Answer: Decline to answer        ~~~~~~~~~~~~~~~~~~~~~~~~~~~~~~~~  STRESS

## 2017-01-12 NOTE — Progress Notes
Question: Do you feel stress - tense, restless, nervous, or anxious, or unable to sleep at night because your mind is troubled all the time - these days?   Answer: Not at all        ~~~~~~~~~~~~~~~~~~~~~~~~~~~~~~~~  SOCIAL CONNECTIONS   Question: In a typical week, how many times do you talk on the phone with family, friends or neighbors?   Answer: More than three times a week     Question: How often do you get together with friends or relatives?   Answer: Decline to answer     Question: How often do you attend church or religious services?   Answer: Decline to answer     Question: Do you belong to any clubs or organizations such as church groups, unions, fraternal or athletic groups, or school groups?   Answer: No     Question: How often  do you attend meetings of the clubs or organizations you belong to?   Answer: Never     Question: Do you currently live with your spouse or significant other?   Answer: No        ~~~~~~~~~~~~~~~~~~~~~~~~~~~~~~~~  PERSONAL SAFETY   Question: Within the last year, have you been afraid of your partner or ex-partner?   Answer: No     Question: Within the last year,  have you been humiliated or emotionally abused in other ways by your partner or ex-partner?   Answer: No     Question: Within the last year, have you been kicked, hit, slapped, or otherwise physically hurt by your partner or ex-partner?   Answer: No     Question: Within the last year, have you been raped or forced to have any kind of sexual activity by your partner or ex-partner?   Answer:  No

## 2017-01-19 ENCOUNTER — Ambulatory Visit: Attending: Nurse Practitioner | Primary: Family Medicine

## 2017-01-19 DIAGNOSIS — D500 Iron deficiency anemia secondary to blood loss (chronic): Secondary | ICD-10-CM

## 2017-01-19 DIAGNOSIS — G478 Other sleep disorders: Secondary | ICD-10-CM

## 2017-01-19 DIAGNOSIS — G43009 Migraine without aura, not intractable, without status migrainosus: Principal | ICD-10-CM

## 2017-01-19 DIAGNOSIS — G8929 Other chronic pain: Secondary | ICD-10-CM

## 2017-01-19 DIAGNOSIS — M1990 Unspecified osteoarthritis, unspecified site: Principal | ICD-10-CM

## 2017-01-19 DIAGNOSIS — N921 Excessive and frequent menstruation with irregular cycle: Secondary | ICD-10-CM

## 2017-01-19 DIAGNOSIS — H538 Other visual disturbances: Secondary | ICD-10-CM

## 2017-01-19 DIAGNOSIS — R51 Headache: Secondary | ICD-10-CM

## 2017-01-19 DIAGNOSIS — M545 Low back pain: Secondary | ICD-10-CM

## 2017-01-19 DIAGNOSIS — G43909 Migraine, unspecified, not intractable, without status migrainosus: Secondary | ICD-10-CM

## 2017-01-19 DIAGNOSIS — D649 Anemia, unspecified: Secondary | ICD-10-CM

## 2017-01-19 MED ORDER — GABAPENTIN 100 MG PO CAPS
100 mg | Freq: Every day | ORAL | 0 refills | Status: CP | PRN
Start: 2017-01-19 — End: 2017-07-13

## 2017-01-19 MED ORDER — AMITRIPTYLINE HCL 50 MG PO TABS
50 mg | Freq: Every evening | ORAL | 1 refills | Status: CP
Start: 2017-01-19 — End: 2017-07-07

## 2017-01-19 NOTE — Progress Notes
Subjective:   Monica Snyder is a 31 y.o. female being seen today for Follow-up and Medications Refill       HPI  -Pt presents to clinic today for f/u of migraine-pt was started on elavil previously as topamax was not working-pt reports current med is also not working and states she is having migraines daily (continues with blurry vision associated) -pt was to f/u 4 weeks after inititation of med for titration  -Pt also stating she is having issues when sleeping-she will wake up SOB, unable to move-pt reports she is unsure how long they are lasting bc by the time she is fully awake she feels as though she "blacked out". Pt reports symptoms have been ongoing since 2014-pt denies any trauma. Pt [place] but, states they were unable to determine dx-states they wanted to do a "brain biopsy". Discussion followed  -PT current weight/BMI=268/38 was 267/38  Last Wt, Ht, and BMI    Wt Readings from Last 3 Encounters:   01/19/17 121.6 kg (268 lb)   12/29/16 124 kg (273 lb 6.4 oz)   11/08/16 121.2 kg (267 lb 3.2 oz)     Ht Readings from Last 3 Encounters:   01/19/17 1.775 m (5' 9.88")   12/29/16 1.775 m (5' 9.88")   11/08/16 1.775 m (5' 9.88")     Facility age limit for growth percentiles is 20 years.    -Pt with at goal BP    -Pt reports irregular cycles and anemia noted-last hgb was 9.8; pt has been taking oral hemoglobin    -Pt former smoker    -PT denies any other acute complaints    From Previous Visit:  -Pt presents to clinic today for discussion of vaginal discharge and bilateral breast pain/sores along with rash under bilateral breasts. Pt reports same issues with breasts in different state and had mammo performed-reports she was supposed to return in 6 months for reimaging but, did not. Pt reports she has bilateral pain and lump under R axilla. Discussion followed  -PT with rash under bilat breasts-states she has been using nystatin

## 2017-01-19 NOTE — Progress Notes
powder with no improvement. PT with very large breasts causing indentations to bilateral shoulders and chronic back pain. Pt current size of 54G and LL in other brands. Discussion followed  -PT reports continued migraines-pt is currently taking topamax with no improvement. Pt would like to discuss options. Discussion followed  -PT current weight/BMI=267/38  Meygan's Estimated body mass index is 38.59 kg/m? as calculated from the following:    Height as of this encounter: 1.775 m (5' 9.88").    Weight as of this encounter: 121.6 kg (268 lb).      The health risks associated with an elevated BMI were discussed and education was provided.  See orders for any further follow up plans.    -Pt former smoker    -PT denies any other acute complaints    Past Medical History:   Diagnosis Date   ? Anemia    ? Arthritis    ? Frequent headaches    ? Migraine      History reviewed. No pertinent surgical history.  Family History   Problem Relation Age of Onset   ? Asthma Other    ? Diabetes Other    ? High Blood Pressure Other    ? Arthritis Other    ? Ovarian Cancer Other    ? Glaucoma Maternal Aunt      Social History     Social History   ? Marital status: Single     Spouse name: N/A   ? Number of children: N/A   ? Years of education: N/A     Occupational History   ? Not on file.     Social History Main Topics   ? Smoking status: Former Smoker     Packs/day: 0.25     Years: 11.00     Types: Cigarettes     Start date: 2007     Quit date: 11/2016   ? Smokeless tobacco: Never Used   ? Alcohol use No   ? Drug use: No   ? Sexual activity: No     Other Topics Concern   ? Not on file     Social History Narrative   ? No narrative on file     Current Outpatient Prescriptions on File Prior to Visit   Medication Sig   ? [DISCONTINUED] amitriptyline (ELAVIL) 25 MG PO Tablet Take 1 tablet by mouth nightly at bedtime.   ? ferrous sulfate 325 (65 Fe) MG PO Tablet Delayed Release Take 1 tablet by mouth 2 times daily.

## 2017-01-19 NOTE — Progress Notes
I agree with the plan and assessment as above, no teaching statement needed.

## 2017-01-19 NOTE — Progress Notes
?   Hyoscyamine Sulfate (HYOSCYAMINE PO) by mouth.   ? mupirocin (BACTROBAN) 2 % EX Ointment Apply topically 3 times daily to breast sores.   ? nystatin (MYCOSTATIN) 100000 UNIT/GM EX Powder Apply daily as needed   ? tiZANidine (ZANAFLEX) 4 MG PO Tablet Take 1 tablet by mouth every 8 hours as needed.   ? triamcinolone (KENALOG) 0.1 % EX Cream    ? [DISCONTINUED] fluconazole (DIFLUCAN) 150 MG PO Tablet Take 1 tablet by mouth daily for 3 days.   ? [DISCONTINUED] gabapentin (NEURONTIN) 100 MG PO Capsule Take 100 mg by mouth as needed.    ? HYDROcodone-acetaminophen (NORCO) 5-325 MG PO Tablet Take 1 tablet by mouth every 8 hours as needed.     No current facility-administered medications on file prior to visit.      Allergies   Allergen Reactions   ? Cherry Other (See Comments)     Pt did not specify   ? Ginger Other (See Comments)     Pt did not specify   ? Naproxen Other (See Comments)     Pt did not specify   ? Prednisone Other (See Comments)     Pt did not specify     Review of Systems  Review of Systems   Constitutional: Positive for fatigue.   Genitourinary: Positive for menstrual problem.   Musculoskeletal: Positive for back pain.   Skin: Positive for rash.   Neurological: Positive for headaches.        No improvement   Psychiatric/Behavioral: Positive for sleep disturbance.        Sleep paralysis per patient   All other systems reviewed and are negative.      Objective:        VITAL SIGNS (all recorded)      Clinic Vitals     Row Name 01/19/17 1056                Amb Encounter Vitals    Weight 121.6 kg (268 lb)    -CV at 01/19/17 1057       Height 1.775 m (5' 9.88")    -CV at 01/19/17 1057       BMI (Calculated) 38.67    -CV at 01/19/17 1057       BSA (Calculated - sq m) 2.45    -CV at 01/19/17 1057       BP 105/77    -CV at 01/19/17 1057       BP Location Left upper arm    -CV at 01/19/17 1057       Position Sitting    -CV at 01/19/17 1057       Pulse 69    -CV at 01/19/17 1057       Pulse Source Monitor

## 2017-01-19 NOTE — Progress Notes
-  CV at 01/19/17 1057       Resp 16    -CV at 01/19/17 1057       Temp 36.1 ?C (97 ?F)    -CV at 01/19/17 1057       Temperature Source Tympanic    -CV at 01/19/17 1057       Pain Score EIGHT    -CV at 01/19/17 1057       Location legs    -CV at 01/19/17 1057          Education/Communication Barriers?    Learning/Communication Barriers? No    -CV at 01/19/17 1057          Fall Risk Assessment    Had recent fall / Last 6 months? No recent fall    -CV at 01/19/17 1057       Does patient have a fear of falling? No    -CV at 01/19/17 1057         User Key  (r) = Recorded By, (t) = Taken By, (c) = Cosigned By    Initials Name Effective Dates    CV Jeri CosViray, Carol, [place] 11/13/15 -         Physical Exam   Constitutional: She is oriented to person, place, and time. She appears well-developed and well-nourished. No distress.   HENT:   Head: Normocephalic and atraumatic.   Nose: Nose normal.   Eyes: Pupils are equal, round, and reactive to light. Conjunctivae and EOM are normal. Right eye exhibits no discharge. Left eye exhibits no discharge.   Neck: Normal range of motion. No tracheal deviation present.   Cardiovascular: Normal rate, regular rhythm and normal heart sounds.    Pulmonary/Chest: Effort normal. No respiratory distress.   Abdominal: Soft.   Genitourinary:   Genitourinary Comments: Deferred at current   Musculoskeletal: She exhibits no edema or deformity.   LEs  Normal gait   Neurological: She is alert and oriented to person, place, and time.   Skin: Skin is warm and dry.   Psychiatric: She has a normal mood and affect. Her behavior is normal. Judgment and thought content normal.   Vitals reviewed.     ECG - Adult  Date/Time: 01/19/2017 11:44 AM  Performed by: Doylene BodeHAYES, SHERI L  Authorized by: Doylene BodeHAYES, SHERI L   Local anesthesia used: no    Anesthesia:  Local anesthesia used: no    Sedation:  Patient sedated: no  Patient tolerance: Patient tolerated the procedure well with no immediate complications

## 2017-01-19 NOTE — Progress Notes
Comments: No acute issues          Assessment:       ICD-10-CM ICD-9-CM    1. Migraine without aura, not intractable, without status migrainosus G43.009 346.10 Refer to Neurology      amitriptyline (ELAVIL) 50 MG PO Tablet   2. Blurry vision, bilateral H53.8 368.8 Refer to Neurology   3. 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 Iron deficiency anemia due to chronic blood loss D50.0 280.0 POCT hemoglobin      US Pelvic Transabdominal & Transvaginal      ECG - Adult   5. Menometrorrhagia N92.1 626.2 US Pelvic Transabdominal & Transvaginal   6. Chronic midline low back pain without sciatica M54.5 724.2 gabapentin (NEURONTIN) 100 MG PO Capsule    G89.29 338.29      Plan:     Orders Placed This Encounter   Medications   ? amitriptyline (ELAVIL) 50 MG PO Tablet     Sig: Take 1 tablet by mouth nightly at bedtime.     Dispense:  30 tablet     Refill:  1     To replace 25mg    ? gabapentin (NEURONTIN) 100 MG PO Capsule     Sig: Take 1 capsule by mouth daily as needed.     Dispense:  90 capsule     Refill:  0     Orders Placed This Encounter   Procedures   ? US Pelvic Transabdominal & Transvaginal   ? Refer to Pulmonology   ? Refer to Neurology   ? POCT hemoglobin   ? ECG - Adult     -referred to neuro/sleep as above due to above complaints  -will increase elavil in the interim and have pt f/u in 4 weeks  -TV/pelvic US ordered due to menomet; POCT hgb stable for now-continue current supplement  -ECG stable  -Rx renewed as above    Health Maintenance was reviewed. The patient's HM Topic list was:                                            Health Maintenance   Topic Date Due   ? Varicella Vaccine (1 of 2 - 2-dose adolescent series) 01/19/1997   ? USPSTF HIV Risk Assessment  01/20/1999   ? Preventive Wellness Visit  01/19/2002   ? DTaP,Tdap,and Td Vaccines (1 - Tdap) 01/20/2003   ? Pap Smear  01/19/2005   ? Influenza Vaccine (1) 10/02/2016

## 2017-01-20 DIAGNOSIS — L732 Hidradenitis suppurativa: Principal | ICD-10-CM

## 2017-01-20 MED ORDER — MUPIROCIN 2 % EX OINT
0 refills | Status: CP
Start: 2017-01-20 — End: 2017-07-07

## 2017-01-22 DIAGNOSIS — L732 Hidradenitis suppurativa: Principal | ICD-10-CM

## 2017-01-28 MED ORDER — MUPIROCIN 2 % EX OINT
0 refills
Start: 2017-01-28 — End: ?

## 2017-01-28 NOTE — Telephone Encounter
This was sent 12/20  Please advise

## 2017-02-07 ENCOUNTER — Encounter: Primary: Family Medicine

## 2017-02-14 ENCOUNTER — Encounter: Primary: Family Medicine

## 2017-02-16 ENCOUNTER — Encounter: Attending: Nurse Practitioner | Primary: Family Medicine

## 2017-02-28 ENCOUNTER — Inpatient Hospital Stay: Admit: 2017-02-28 | Discharge: 2017-03-01 | Primary: Family Medicine

## 2017-02-28 ENCOUNTER — Inpatient Hospital Stay: Primary: Family Medicine

## 2017-02-28 DIAGNOSIS — D500 Iron deficiency anemia secondary to blood loss (chronic): Secondary | ICD-10-CM

## 2017-02-28 DIAGNOSIS — N83201 Unspecified ovarian cyst, right side: Secondary | ICD-10-CM

## 2017-02-28 DIAGNOSIS — R9389 Abnormal findings on diagnostic imaging of other specified body structures: Secondary | ICD-10-CM

## 2017-02-28 DIAGNOSIS — N83202 Unspecified ovarian cyst, left side: Secondary | ICD-10-CM

## 2017-02-28 DIAGNOSIS — N888 Other specified noninflammatory disorders of cervix uteri: Secondary | ICD-10-CM

## 2017-02-28 DIAGNOSIS — N921 Excessive and frequent menstruation with irregular cycle: Principal | ICD-10-CM

## 2017-02-28 DIAGNOSIS — D259 Leiomyoma of uterus, unspecified: Secondary | ICD-10-CM

## 2017-03-08 ENCOUNTER — Encounter: Attending: Nurse Practitioner | Primary: Family Medicine

## 2017-03-10 ENCOUNTER — Encounter: Attending: Family | Primary: Family Medicine

## 2017-03-22 ENCOUNTER — Ambulatory Visit: Admit: 2017-03-22 | Discharge: 2017-03-22 | Attending: Emergency Medicine | Primary: Family Medicine

## 2017-03-22 DIAGNOSIS — D649 Anemia, unspecified: Secondary | ICD-10-CM

## 2017-03-22 DIAGNOSIS — G43909 Migraine, unspecified, not intractable, without status migrainosus: Secondary | ICD-10-CM

## 2017-03-22 DIAGNOSIS — R51 Headache: Secondary | ICD-10-CM

## 2017-03-22 DIAGNOSIS — M25562 Pain in left knee: Principal | ICD-10-CM

## 2017-03-22 DIAGNOSIS — Z87891 Personal history of nicotine dependence: Secondary | ICD-10-CM

## 2017-03-22 DIAGNOSIS — Z888 Allergy status to other drugs, medicaments and biological substances status: Secondary | ICD-10-CM

## 2017-03-22 DIAGNOSIS — Z886 Allergy status to analgesic agent status: Secondary | ICD-10-CM

## 2017-03-22 DIAGNOSIS — M25462 Effusion, left knee: Secondary | ICD-10-CM

## 2017-03-22 DIAGNOSIS — S8002XA Contusion of left knee, initial encounter: Secondary | ICD-10-CM

## 2017-03-22 DIAGNOSIS — Z91018 Allergy to other foods: Secondary | ICD-10-CM

## 2017-03-22 DIAGNOSIS — Z79899 Other long term (current) drug therapy: Secondary | ICD-10-CM

## 2017-03-22 DIAGNOSIS — M1990 Unspecified osteoarthritis, unspecified site: Secondary | ICD-10-CM

## 2017-03-22 MED ORDER — IBUPROFEN 400 MG PO TABS
400 mg | Freq: Four times a day (QID) | ORAL | 1 refills | Status: CP | PRN
Start: 2017-03-22 — End: 2017-06-07

## 2017-03-22 NOTE — Progress Notes
Subjective:   Monica Snyder is a 32 y.o. female being seen today for Knee Pain (New Patient - Left Knee)       [age] female presents with left knee pain  No history of injury prior to thanksgiving when she bumped it in the shower  Has had pain swelling since  Worse with stairs, sitting, standing  Has tried meds nothing else      Knee Pain   Associated symptoms include arthralgias, fatigue, headaches and myalgias. Pertinent negatives include no abdominal pain, chest pain, chills, congestion, coughing, diaphoresis, fever, nausea, neck pain, numbness, rash, sore throat, vomiting or weakness.       Past Medical History:   Diagnosis Date   ? Anemia    ? Arthritis    ? Frequent headaches    ? Migraine      History reviewed. No pertinent surgical history.  Family History   Problem Relation Age of Onset   ? Asthma Other    ? Diabetes Other    ? High Blood Pressure Other    ? Arthritis Other    ? Ovarian Cancer Other    ? Glaucoma Maternal Aunt      Social History     Social History   ? Marital status: Single     Spouse name: N/A   ? Number of children: N/A   ? Years of education: N/A     Occupational History   ? Not on file.     Social History Main Topics   ? Smoking status: Former Smoker     Packs/day: 0.25     Years: 11.00     Types: Cigarettes     Start date: 2007     Quit date: 11/2016   ? Smokeless tobacco: Never Used   ? Alcohol use No   ? Drug use: No   ? Sexual activity: No     Other Topics Concern   ? Not on file     Social History Narrative   ? No narrative on file     Current Outpatient Prescriptions on File Prior to Visit   Medication Sig   ? amitriptyline (ELAVIL) 50 MG PO Tablet Take 1 tablet by mouth nightly at bedtime.   ? ferrous sulfate 325 (65 Fe) MG PO Tablet Delayed Release Take 1 tablet by mouth 2 times daily.   ? gabapentin (NEURONTIN) 100 MG PO Capsule Take 1 capsule by mouth daily as needed.   ? Hyoscyamine Sulfate (HYOSCYAMINE PO) by mouth.

## 2017-03-22 NOTE — Progress Notes
?   mupirocin (BACTROBAN) 2 % EX Ointment APPLY  OINTMENT TOPICALLY THREE TIMES DAILY TO  BREAST  SORES   ? nystatin (MYCOSTATIN) 100000 UNIT/GM EX Powder Apply daily as needed   ? tiZANidine (ZANAFLEX) 4 MG PO Tablet Take 1 tablet by mouth every 8 hours as needed.   ? triamcinolone (KENALOG) 0.1 % EX Cream    ? HYDROcodone-acetaminophen (NORCO) 5-325 MG PO Tablet Take 1 tablet by mouth every 8 hours as needed.     No current facility-administered medications on file prior to visit.      Allergies   Allergen Reactions   ? Cherry Other (See Comments)     Pt did not specify   ? Ginger Other (See Comments)     Pt did not specify   ? Naproxen Other (See Comments)     Pt did not specify   ? Prednisone Other (See Comments)     Pt did not specify         Review of Systems  Review of Systems   Constitutional: Positive for fatigue. Negative for activity change, appetite change, chills, diaphoresis, fever and unexpected weight change.   HENT: Negative for congestion, dental problem, ear discharge, ear pain, facial swelling, hearing loss, mouth sores, nosebleeds, postnasal drip, rhinorrhea, sinus pressure, sneezing, sore throat, trouble swallowing and voice change.    Eyes: Positive for photophobia, redness and itching. Negative for pain, discharge and visual disturbance.   Respiratory: Negative for apnea, cough, choking, chest tightness, shortness of breath, wheezing and stridor.    Cardiovascular: Negative for chest pain and leg swelling.   Gastrointestinal: Negative for abdominal distention, abdominal pain, anal bleeding, blood in stool, constipation, diarrhea, nausea, rectal pain and vomiting.   Endocrine: Negative for cold intolerance, heat intolerance, polydipsia, polyphagia and polyuria.   Genitourinary: Negative for decreased urine volume, difficulty urinating, dyspareunia, dysuria, enuresis, flank pain, genital sores, hematuria, menstrual problem and vaginal bleeding.

## 2017-03-22 NOTE — Progress Notes
Concerned about tingling, describing radicular pain, +seated straight leg, has appointment with pain management for her back  F/u 3 months    Health Maintenance was reviewed. The patient's HM Topic list was:                                            Health Maintenance   Topic Date Due   ? Varicella Vaccine (1 of 2 - 2-dose adolescent series) 01/19/1997   ? USPSTF HIV Risk Assessment  01/20/1999   ? Preventive Wellness Visit  01/19/2002   ? DTaP,Tdap,and Td Vaccines (1 - Tdap) 01/20/2003   ? Pap Smear  01/19/2005   ? Influenza Vaccine (1) 10/02/2016

## 2017-03-22 NOTE — Progress Notes
Musculoskeletal: Positive for arthralgias, back pain and myalgias. Negative for gait problem, neck pain and neck stiffness.   Skin: Negative for color change, pallor, rash and wound.   Allergic/Immunologic: Negative for environmental allergies and immunocompromised state.   Neurological: Positive for headaches. Negative for dizziness, tremors, seizures, syncope, facial asymmetry, speech difficulty, weakness, light-headedness and numbness.   Hematological: Negative for adenopathy. Does not bruise/bleed easily.   Psychiatric/Behavioral: Negative for agitation, behavioral problems, confusion, decreased concentration, dysphoric mood, hallucinations, self-injury, sleep disturbance and suicidal ideas. The patient is not nervous/anxious and is not hyperactive.            Objective:        VITAL SIGNS (all recorded)      Clinic Vitals     Row Name 03/22/17 1430                Amb Encounter Vitals    Weight 120.9 kg (266 lb 9.6 oz)    -MW at 03/22/17 1431       Height 1.778 m (5\' 10" )    -MW at 03/22/17 1431       BMI (Calculated) 38.33    -MW at 03/22/17 1431       BSA (Calculated - sq m) 2.44    -MW at 03/22/17 1431       BP 126/80    -MW at 03/22/17 1431       BP Location Right upper arm    -MW at 03/22/17 1431       Position Sitting    -MW at 03/22/17 1431       Pulse 77    -MW at 03/22/17 1431       Resp 16    -MW at 03/22/17 1431       Temp 36.1 ?C (96.9 ?F)    -MW at 03/22/17 1431       Temperature Source Oral    -MW at 03/22/17 1431       Pain Score Five    -MW at 03/22/17 1431          Education/Communication Barriers?    Learning/Communication Barriers? No    -MW at 03/22/17 1431          Fall Risk Assessment    Had recent fall / Last 6 months? No recent fall    -MW at 03/22/17 1431       Does patient have a fear of falling? No    -MW at 03/22/17 1431         User Key  (r) = Recorded By, (t) = Taken By, (c) = Cosigned By    [facility] Name Effective Dates    MW Violet BaldyWalker, Myra R, [place] 07/29/15 -         Physical Exam

## 2017-03-22 NOTE — Progress Notes
Constitutional: She is oriented to person, place, and time. She appears well-developed and well-nourished.   HENT:   Head: Normocephalic and atraumatic.   Eyes: Pupils are equal, round, and reactive to light. Conjunctivae are normal.   Neck: Neck supple. No tracheal deviation present.   Cardiovascular: Intact distal pulses.    Pulmonary/Chest: Effort normal.   Neurological: She is alert and oriented to person, place, and time.   Skin: Skin is warm and dry.   Psychiatric: She has a normal mood and affect.   Nursing note and vitals reviewed.     Left Knee Exam  Inspection: No deformities. No joint effusion. No erythema, warmth, edema, ecchymosis. No atrophy or hypertrophy is appreciated.  Palpation: peripatellar pain, hamstring ttp med/lat  AROM: flexion extension 0-140 degrees.   PROM: flexion extension 0-140 degrees.   Strength: Quadriceps, hamstrings, TA, GCS strength 5/5  Neurovascular: DP/TP pulse 2+. SILT    Knee Special tests  Lachman's - wnl  Anterior drawer - wnl  Posterior drawer - wnl  McMurray's - wnl  Varus and valgus stress at 0 and 30 - wnl  Patellar grind - positive for pain    Assessment:       ICD-10-CM ICD-9-CM    1. Acute pain of left knee M25.562 719.46 Refer to Orthopedic Surger   2. Contusion of left knee, initial encounter S80.02XA 924.11 Refer to Orthopedic Surger      ibuprofen (ADVIL,MOTRIN) 400 MG PO Tablet      Refer to External Provider          Plan:     Orders Placed This Encounter   Medications   ? ibuprofen (ADVIL,MOTRIN) 400 MG PO Tablet     Sig: Take 1 tablet by mouth every 6 hours as needed for pain or fever.     Dispense:  90 tablet     Refill:  1     Orders Placed This Encounter   Procedures   ? 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

## 2017-04-05 ENCOUNTER — Ambulatory Visit: Admit: 2017-04-05 | Discharge: 2017-04-05 | Attending: Family | Primary: Family Medicine

## 2017-04-05 DIAGNOSIS — R51 Headache: Principal | ICD-10-CM

## 2017-04-05 DIAGNOSIS — M546 Pain in thoracic spine: Secondary | ICD-10-CM

## 2017-04-05 DIAGNOSIS — D649 Anemia, unspecified: Secondary | ICD-10-CM

## 2017-04-05 DIAGNOSIS — Z87891 Personal history of nicotine dependence: Secondary | ICD-10-CM

## 2017-04-05 DIAGNOSIS — M1990 Unspecified osteoarthritis, unspecified site: Secondary | ICD-10-CM

## 2017-04-05 DIAGNOSIS — Z791 Long term (current) use of non-steroidal anti-inflammatories (NSAID): Secondary | ICD-10-CM

## 2017-04-05 DIAGNOSIS — Z79891 Long term (current) use of opiate analgesic: Secondary | ICD-10-CM

## 2017-04-05 DIAGNOSIS — M5416 Radiculopathy, lumbar region: Secondary | ICD-10-CM

## 2017-04-05 DIAGNOSIS — M545 Low back pain: Secondary | ICD-10-CM

## 2017-04-05 DIAGNOSIS — R202 Paresthesia of skin: Secondary | ICD-10-CM

## 2017-04-05 NOTE — Progress Notes
Hyperactive: No  Sleep Disturbance: No

## 2017-04-05 NOTE — Progress Notes
?   tiZANidine (ZANAFLEX) 4 MG PO Tablet Take 1 tablet by mouth every 8 hours as needed.   ? triamcinolone (KENALOG) 0.1 % EX Cream    ? gabapentin (NEURONTIN) 100 MG PO Capsule Take 1 capsule by mouth daily as needed.   ? HYDROcodone-acetaminophen (NORCO) 5-325 MG PO Tablet Take 1 tablet by mouth every 8 hours as needed.     Last reviewed on 04/05/2017 11:02 AM by Jobe GibbonBorowski, Diane, MA    Allergies:  Allergies   Allergen Reactions   ? Cherry Other (See Comments)     Pt did not specify   ? Ginger Other (See Comments)     Pt did not specify   ? Naproxen Other (See Comments)     Pt did not specify   ? Prednisone Other (See Comments)     Pt did not specify       Review of Systems:    General: denies chills, fever, abscess ,fatigue    Respiratory: denies shortness of breath, cough, wheezing, asthma, difficulty breathing    Cardiovascular:  denies chest pain, palpitations, swelling of extremities    Endocrine: denies weight loss, weight gain, thyroid problems, diabetes mellitus    Immune status: denies HIV, H/o TB, Hepatitis C    GI: Denies nausea, vomiting and constipation.    GU: Denies incontinence of the bladder and incontinence of the bowels.    Musculoskeletal: Reports lower back pain    Neurological:: Reports tingling and numbness    Psychiatric: Denies  mood changes, depression, anxiety and inability to sleep due to pain    Vital Signs:     Vitals Recorded in This Encounter       04/05/2017 1100             BP: 119/51    Pulse: 78    Pulse Source: Monitor    Pulse Quality: Normal    Resp: 18    Respiration Quality: Normal    Temp: 36.4 ?C (97.5 ?F)    Weight: 124.7 kg (275 lb)    Height: 1.778 m (5\' 10" )    BMI (Calculated): 39.54    Pain Score: SEVEN    Learning/Communication Barriers?: No    Had recent fall / Last 6 months?: No recent fall    Does patient have a fear of falling?: No          Physical Exam:  GENERAL APPEARANCE:  Obese, normal gait, not in distress.

## 2017-04-05 NOTE — Progress Notes
Chief Complaint: back pain    PCP:   Waters, Lynn Michael Jr., DO [facility]         Referring Provider:   Delena ServeWaters, Lynn Michael Jr., DO [facility]     Diagnosis   R51 (ICD-10-CM) - Nonintractable headache, unspecified chronicity pattern, unspecified headache type   M54.5 (ICD-10-CM) - Low back pain without sciatica, unspecified back pain laterality, unspecified chronicity   R20.2 (ICD-10-CM) - Paresthesia         HPI: This patient is a 32 y.o. female presenting [facility] to establish care as a new patient. Patient presents with complaints of:    Location of the pain: mid to low back pain that radiates to the feet L>R; migraine headaches (was on Topamax, Propranolol).  Duration of the pain: >10 years.  Precipitating event of the pain: started when "breasts started getting bigger".  Frequency of the pain: constant.  Quality of the pain: burning, numbness, sharp and throbbing.  Numeric Pain scale:  4 /10 with medications to 8 /10 without medications.  Associated symptoms:  include  tingling  and numbness feet (non diabetic).  Patient denies  incontinence of urine, incontinenceof bowel and cancer.  Aggravating factors: standing, leaning over.  Relieving factors: pain medications, heat, massages.  Physical Therapy/Chiropractic services for more than 6 weeks: never.  NSAID use for more than 6 weeks: allergy to Naproxen; but able to take Ibuprofen.  Previous treatment for the pain: none.  Prior surgery related to pain: none.  Neurology evaluation: none.   Plastic Surgery: appointment on 04/27/2017 re: breast reduction  Past medical records reviewed: recent PCP notes   Imaging:   XR L SPINE 2 OR 3 VIEWS  ?  Indications:  Low back pain without sciatica, unspecified back pain laterality, unspecified   chronicity  Paresthesia  ?  ?  IMPRESSION:  Findings/Impression:

## 2017-04-05 NOTE — Progress Notes
MENTAL STATUS:  Alert, oriented to time, place, and person. Cooperative and appropriate in answering questions.   HEAD:  Normal. No tenderness on percussion over the facial nerves or occipital nerves at this time.  EYES, EARS, NOSE:  Normal.  CERVICAL SPINE:  Normal range of motion, with no tenderness on palpation of the paracervical muscles.  THORACIC SPINE:  Normal curvature, with tenderness of the paravertebral muscles. Shoulder muscles, including supra- and infraspinatus muscles, as well as trapezius muscles did not have any trigger points.  LUMBAR SPINE:  Mild tenderness on palpation of the lower spinous processes and lower facet joints. No SI joint tenderness. Straight-leg raising was negative, both on the right and the left side.  HEART:  Regular rate and rhythm.  The lungs were clear bilaterally.   UPPER EXTREMITIES:  Normal motor and sensory function.  LOWER EXTREMITIES:  No muscle wasting.  Sensory and motor examination was Normal in both lower extremities. Symmetric.    Assessment:      ICD-10-CM ICD-9-CM    1. Nonintractable headache, unspecified chronicity pattern, unspecified headache type R51 784.0 Refer to Pain Management   2. Low back pain without sciatica, unspecified back pain laterality, unspecified chronicity M54.5 724.2 Refer to Pain Management   3. Paresthesia R20.2 782.0 Refer to Pain Management       Plan and Recommendations:  A long discussion was held with the patient.  The following treatment plan was formulated and agreed to by the patient.  The treatment plan is outlined below:    Medical therapy:    Recommendations:   Continue Hydrocodone/APAP for moderate to severe pain  Increase Gabapentin 100 mg tid as tolerated or switch to Lyrica 50 mg tid.     Physical medicine: We recommend this patient participate in range of motion and stretching exercises as tolerated. , Weight loss is strongly urged for this patient.      Interventional therapy: We will re-evaluate patient after PT. We will

## 2017-04-05 NOTE — Progress Notes
order L-SPINE MRI.  Followup: The patient will follow up in 3 months. after PT or sooner as needed.     Answers for HPI/ROS submitted by the patient on 03/30/2017   Change in activity: No  Change in appetite: No  Chills: No  diaphoresis: No  fatigue: Yes  fever: No  Weight Change: No  Dental Problem: No  ear discharge: No  ear pain: No  hearing loss: No  facial swelling: No  drooling: No  mouth sores: No  congestion: No  Nosebleeds: No  postnasal drip: No  rhinorrhea: No  sinus pressure: No  sneezing: No  sore throat: No  trouble swallowing: No  visual disturbance: No  Eye discharge: No  Eye Itching: No  eye pain: No  Eye redness: Yes  Eye discomfort light: Yes  Breathing stops temporarily (apnea): No  Chest Tightness: No  Choking: No  cough: No  shortness of breath: No  stridor: No  wheezing: No  leg swelling: No  Rapid, fluttering, pounding heart: Yes  Chest Pain: No  Swollen Abdomen: No  abdominal pain: Yes  anal bleeding: No  Blood in stool: No  constipation: No  diarrhea: No  nausea: No  Rectal Pain: No  Vomiting: No  Cold Intolerance: No  Heat Intolerance: No  polydipsia: No  polyphagia: No  Inability to control urine: No  Menstrual Problem: No  Decreased Urine Amount: No  Difficulty Urinating: No  dysuria: No  hematuria: No  polyuria: No  Vaginal Bleeding: No  Vaginal Pain: No  genital lesions: No  Painful Sex: No  back pain: Yes  arthralgias: Yes  myalgias: Yes  Neck stiffness: No  color change: No  pallor: No  Wound: No  Environmental allergies: No  Food Allergies: No  Immunocompromised (unable to fight infection): No  Difficulty speaking: No  Fainting: No  headaches: Yes  dizziness: No  Facial asymmetry: No  light-headedness: No  seizures: No  tremors: No  weakness: No  andenopathy: No  Bruise or bleed easily: No  Behavioral Problem: No  Confusion: No  Decreased concentration : No  Nervous or Anxious: No  Hallucinations - seeing things that are not really there: No  Self Injury: No  Agitation: No

## 2017-04-05 NOTE — Progress Notes
Normal lumbar spine alignment.  Normal disk spacing and endplates.  Negative for fracture.   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  ?  ?  Read By Alysia Penna- Paul Wasserman D.O.    Electronically Verified By Alysia Penna- Paul Wasserman D.O.    Released Date Time - 09/22/2016 2:59 PM      Past Medical History:  Past Medical History:   Diagnosis Date   ? Anemia    ? Arthritis    ? Frequent headaches    ? Migraine        Past Surgical History:  No past surgical history on file.    Family History:  Family History   Problem Relation Age of Onset   ? Asthma Other    ? Diabetes Other    ? High Blood Pressure Other    ? Arthritis Other    ? Ovarian Cancer Other    ? Glaucoma Maternal Aunt        Social History:  Social History     Social History   ? Marital status: Single     Spouse name: N/A   ? Number of children: N/A   ? Years of education: N/A     Occupational History   ? Not on file.     Social History Main Topics   ? Smoking status: Former Smoker     Packs/day: 0.25     Years: 11.00     Types: Cigarettes     Start date: 2007     Quit date: 11/2016   ? Smokeless tobacco: Never Used   ? Alcohol use No   ? Drug use: No   ? Sexual activity: No     Other Topics Concern   ? Not on file     Social History Narrative   ? No narrative on file       Current Meds:  Outpatient Medications Prior to Visit   Medication Sig   ? amitriptyline (ELAVIL) 50 MG PO Tablet Take 1 tablet by mouth nightly at bedtime.   ? ferrous sulfate 325 (65 Fe) MG PO Tablet Delayed Release Take 1 tablet by mouth 2 times daily.   ? Hyoscyamine Sulfate (HYOSCYAMINE PO) by mouth.   ? ibuprofen (ADVIL,MOTRIN) 400 MG PO Tablet Take 1 tablet by mouth every 6 hours as needed for pain or fever.   ? mupirocin (BACTROBAN) 2 % EX Ointment APPLY  OINTMENT TOPICALLY THREE TIMES DAILY TO  BREAST  SORES   ? nystatin (MYCOSTATIN) 100000 UNIT/GM EX Powder Apply daily as needed

## 2017-04-05 NOTE — Patient Instructions
Patient given verbal and written instructions for next appointment.  Patient also provided next appointment date/time at discharge.

## 2017-04-06 NOTE — Progress Notes
No teaching statement needed

## 2017-04-08 DIAGNOSIS — M545 Low back pain: Secondary | ICD-10-CM

## 2017-04-08 DIAGNOSIS — M62838 Other muscle spasm: Secondary | ICD-10-CM

## 2017-04-08 DIAGNOSIS — D509 Iron deficiency anemia, unspecified: Principal | ICD-10-CM

## 2017-04-08 MED ORDER — FERROUS SULFATE 325 (65 FE) MG PO TBEC
325 mg | Freq: Two times a day (BID) | ORAL | 3 refills | Status: CP
Start: 2017-04-08 — End: 2017-07-07

## 2017-04-08 MED ORDER — TIZANIDINE HCL 4 MG PO TABS
4 mg | Freq: Three times a day (TID) | ORAL | 1 refills | Status: CP | PRN
Start: 2017-04-08 — End: ?

## 2017-04-08 MED ORDER — TIZANIDINE HCL 4 MG PO TABS
4 mg | Freq: Three times a day (TID) | ORAL | 1 refills | Status: CP | PRN
Start: 2017-04-08 — End: 2017-07-07

## 2017-04-08 MED ORDER — FERROUS SULFATE 325 (65 FE) MG PO TBEC
325 mg | Freq: Two times a day (BID) | ORAL | 3 refills | Status: CP
Start: 2017-04-08 — End: ?

## 2017-04-08 MED ORDER — HYDROCODONE-ACETAMINOPHEN 5-325 MG PO TABS
1 | ORAL_TABLET | Freq: Three times a day (TID) | ORAL | 0 refills | PRN
Start: 2017-04-08 — End: ?

## 2017-04-08 NOTE — Telephone Encounter
Last Read in [facility]  04/08/2017 12:53 PM by Nicola PoliceAmanda Venn

## 2017-04-08 NOTE — Telephone Encounter
My chart msg sent

## 2017-04-08 NOTE — Telephone Encounter
Iron and muscle relaxer renewed  Hydrocodone declined

## 2017-04-08 NOTE — Telephone Encounter
From: Monica PoliceAmanda Rockhold  Sent: 04/07/2017 4:04 PM EST  Subject: Medication Renewal Request    Monica Snyder would like a refill of the following medications:     tiZANidine (ZANAFLEX) 4 MG PO Tablet Delena Serve[Lynn Michael Waters Jr., DO]     ferrous sulfate 325 (65 Fe) MG PO Tablet Delayed Release [Lynn Harmon DunMichael Waters Jr., DO]     HYDROcodone-acetaminophen (NORCO) 5-325 MG PO Tablet Larita Fife[Lynn Harmon DunMichael Waters  Jr., DO]    [facility] [place] [place] - [address] [phone]

## 2017-04-19 ENCOUNTER — Inpatient Hospital Stay: Admit: 2017-04-19 | Discharge: 2017-04-20 | Attending: Pulmonary Disease | Primary: Family Medicine

## 2017-04-19 DIAGNOSIS — R0689 Other abnormalities of breathing: Secondary | ICD-10-CM

## 2017-04-19 DIAGNOSIS — Z791 Long term (current) use of non-steroidal anti-inflammatories (NSAID): Secondary | ICD-10-CM

## 2017-04-19 DIAGNOSIS — M1990 Unspecified osteoarthritis, unspecified site: Secondary | ICD-10-CM

## 2017-04-19 DIAGNOSIS — D509 Iron deficiency anemia, unspecified: Secondary | ICD-10-CM

## 2017-04-19 DIAGNOSIS — R0681 Apnea, not elsewhere classified: Secondary | ICD-10-CM

## 2017-04-19 DIAGNOSIS — G43909 Migraine, unspecified, not intractable, without status migrainosus: Secondary | ICD-10-CM

## 2017-04-19 DIAGNOSIS — G4733 Obstructive sleep apnea (adult) (pediatric): Principal | ICD-10-CM

## 2017-04-19 DIAGNOSIS — Z79899 Other long term (current) drug therapy: Secondary | ICD-10-CM

## 2017-04-19 DIAGNOSIS — E669 Obesity, unspecified: Secondary | ICD-10-CM

## 2017-04-19 DIAGNOSIS — Z87891 Personal history of nicotine dependence: Secondary | ICD-10-CM

## 2017-04-19 DIAGNOSIS — G478 Other sleep disorders: Secondary | ICD-10-CM

## 2017-04-19 DIAGNOSIS — R0683 Snoring: Secondary | ICD-10-CM

## 2017-04-19 NOTE — Progress Notes
Glaucoma in her maternal aunt; High Blood Pressure in an other family member; Ovarian Cancer in an other family member.  The patient's  reports that she quit smoking about 5 months ago. Her smoking use included Cigarettes. She started smoking about 12 years ago. She has a 2.75 pack-year smoking history. She has never used smokeless tobacco. She reports that she does not drink alcohol or use drugs.    Current Outpatient Medications    Medication Sig Start Date End Date Taking? Authorizing Provider   amitriptyline (ELAVIL) 50 MG PO Tablet Take 1 tablet by mouth nightly at bedtime. 01/19/17  Yes Raquel JamesHayes, Sheri L, APRN   ferrous sulfate 325 (65 Fe) MG PO Tablet Delayed Release Take 1 tablet by mouth 2 times daily. 04/08/17  Yes Waters, Tacy DuraLynn Michael Jr., DO   ferrous sulfate 325 (65 Fe) MG PO Tablet Delayed Release Take 1 tablet by mouth 2 times daily. 04/08/17  Yes Raquel JamesHayes, Sheri L, APRN   gabapentin (NEURONTIN) 100 MG PO Capsule Take 1 capsule by mouth daily as needed. 01/19/17  Yes Raquel JamesHayes, Sheri L, APRN   Hyoscyamine Sulfate (HYOSCYAMINE PO) by mouth.   Yes Information, Historical   ibuprofen (ADVIL,MOTRIN) 400 MG PO Tablet Take 1 tablet by mouth every 6 hours as needed for pain or fever. 03/22/17  Yes Kiel, Clayborne ArtistJohn Michael, DO   mupirocin (BACTROBAN) 2 % EX Ointment APPLY  OINTMENT TOPICALLY THREE TIMES DAILY TO  BREAST  SORES 01/20/17  Yes Raquel JamesHayes, Sheri L, APRN   nystatin (MYCOSTATIN) 100000 UNIT/GM EX Powder Apply daily as needed 11/08/16  Yes Raquel JamesHayes, Sheri L, APRN   tiZANidine (ZANAFLEX) 4 MG PO Tablet Take 1 tablet by mouth every 8 hours as needed. 04/08/17  Yes Waters, Tacy DuraLynn Michael Jr., DO   tiZANidine (ZANAFLEX) 4 MG PO Tablet Take 1 tablet by mouth every 8 hours as needed. 04/08/17  Yes Raquel JamesHayes, Sheri L, APRN   triamcinolone (KENALOG) 0.1 % EX Cream  03/16/16  Yes Information, Historical   HYDROcodone-acetaminophen (NORCO) 5-325 MG PO Tablet Take 1 tablet by mouth every 8 hours as needed. 12/29/16 01/05/17  Delena ServeWaters, Lynn Michael Jr.,

## 2017-04-19 NOTE — Progress Notes
Assessment:    ICD-10-CM ICD-9-CM    1. 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

## 2017-04-19 NOTE — Progress Notes
DO           Review of Systems:  Constitutional: no unexplained fevers.  Eyes: No recent visual changes or discomfort.  ENT: hearing unchanged:  CV: denies chest pain, DOE, palpitations, leg swelling  Resp: No new cough or change in chronic cough: denies hemoptysis, sputum production, or wheezing.  GI: denies changes in appetite or bowel habits;   GU: denies changes in frequency or volume of urine, ability to void   MS: no change in muscle strength; denies joint pain, swelling, or stiffness.  Skin: denies recent changes in hair, skin, or nails.   Neuro: denies seizures, syncope or memory changes;  Psyche: no change in mood, level of anxiety, or ability to sleep. no undue sadness or suicidal thoughts.  Heme: No easy brusing or bleeding      Physical Exam:  Vitals:    04/19/17 1032   BP: 125/59   Pulse: 83   Resp: 16   Temp: 36.8 ?C (98.2 ?F)   Weight: 122.9 kg (270 lb 14.4 oz)   Height: 1.778 m (5\' 10" )     GENERAL: Well developed, well nourished, appears stated age,  EYES: Lids without erythema or lesion, conjunctivae pink, sclera white.  ENT: external ears symmetric without lesions or deformity,   Oropharynx without lesions, erythema or exudate. Dentition in good/poor repair with/without caries.  MP ***  Tonsils ***  NECK: without thyromegaly, nodules or masses.   CV: RRR with normal S1/S2, no S3 or S4, no murmur: no JVD; pedal pulses 2+ and symmetric, no pedal edema.  RESPIRATORY: Non labored breathing, symmetric chest wall movement, No wheezes, rales or rhonchi, chest resonant.  ABD: soft, nondistended, with normoactive bowel sounds.   LYMPHATIC: no neck lymphadenopathy noted.  MUSCULOSKELETAL: Normal gait and station,   Digits/nails without cyanosis or clubbing.   NEUROLOGIC: Grossly Normal   SKIN: No rashes or suspicious lesions noted.   FPsyche: oriented to time, person, place; normal speech and content; appropriate mood and affect; interactive and responsive; memory, judgement, and insight are intact

## 2017-04-19 NOTE — Progress Notes
Referring Physician: Raquel JamesHayes, Sheri L, APRN      HPI:  Monica Snyder is a  32 y.o. female who comes in today for a new patient evaluation of ***.    Her nocturnal respiratory symptoms are positive for {SNORING/GASPING/CHOKING/WITNESSED APNEAS:33582}.  These symptoms have been going on for {MONTHS/YEARS:29770} and have {IMPROVED/SAME/WORSE:33580}.    Her sleep pattern is such that she is usually in bed by *** p.m.  It takes her *** minutes to fall asleep.  Once asleep, the patient {SLEEP OPTIONS:33602}.  If the patient awakens in the night, she the has {NO/SOME:21577} difficulties falling back to sleep.  The patient practices {UFP DESC EXCELLENT/GOOD/FAIR/MARGINAL/POOR:21024815} sleep hygiene ***.  The patient has a usual wake up time of ***.  Upon awakening in the morning, the patient {ACTIONS; DENIES/REPORTS:28011} morning headaches.  She is {IS/NOT:27538} tired/groggy in the morning.    During the day, the patient has an ESS of Total score: 3.  She {ACTIONS; DENIES/REPORTS:28011} being sleepy during the day.  She {DOES / DOES NOT:32738} nap during the day.  {NAPS INTENTIONAL/INADVERTENT:33603}  The patient {ACTIONS; DENIES/REPORTS:28011} being sleepy while driving.  The patient's daytime symptoms {Actions; do/do not:23415} interfere with her regular daytime activities.    The patient {ACTIONS; DENIES/REPORTS:28011} cataplexy, sleep paralysis, violent dreams, dream enactment, restless legs.    The patient's weight has {UFP NSG/DESC/REMAINED STABLE/INCREASED/DECREASED:21022922}.    The patient's ESS today is Total score: 3.    The patient  has a past medical history of Anemia; Arthritis; Frequent headaches; and Migraine. She also has no past medical history of Contusion of eye.  The patient  has no past surgical history on file.  The patient family history includes Arthritis in an other family member; Asthma in an other family member; Diabetes in an other family member;

## 2017-04-22 NOTE — Progress Notes
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 Iron deficiency anemia, unspecified iron deficiency anemia type D50.9 280.9 FERRITIN      IRON      CBC      FERRITIN      FERRITIN      IRON      IRON      CBC      CBC   3. OSA (obstructive sleep apnea) G47.33 327.23 Diagnostic PSG      CPAP TITRATION ( if indicated)           Plan:  -Sleep paralysis - most common etiology is sleep deprivation, which in this patient may be aggrevated by RLS and OSA.    -OSA - high probability given her anatomy- will order PSG and CPAP if indicated.  Patient was advised that they might end up starting PAP therapy before the next clinic visit and was explained the rules of PAP compliance.  -RLS - patient has known anemia, but has not checked her iron levels in a while.  Ferritin needs to be > 75 for good control of RLS.  Will check labs  -obesity - weight loss encouraged  F/u after studies.

## 2017-04-22 NOTE — Progress Notes
headaches; and Migraine. She also has no past medical history of Contusion of eye.  The patient  has no past surgical history on file.  The patient family history includes Arthritis in an other family member; Asthma in an other family member; Diabetes in an other family member; Glaucoma in her maternal aunt; High Blood Pressure in an other family member; Ovarian Cancer in an other family member.  The patient's  reports that she quit smoking about 5 months ago. Her smoking use included Cigarettes. She started smoking about 12 years ago. She has a 2.75 pack-year smoking history. She has never used smokeless tobacco. She reports that she does not drink alcohol or use drugs.    Current Outpatient Medications    Medication Sig Start Date End Date Taking? Authorizing Provider   amitriptyline (ELAVIL) 50 MG PO Tablet Take 1 tablet by mouth nightly at bedtime. 01/19/17  Yes Hayes-Raulerson, Alma DownsSheri Lynn, APRN   ferrous sulfate 325 (65 Fe) MG PO Tablet Delayed Release Take 1 tablet by mouth 2 times daily. 04/08/17  Yes Waters, Tacy DuraLynn Michael Jr., DO   ferrous sulfate 325 (65 Fe) MG PO Tablet Delayed Release Take 1 tablet by mouth 2 times daily. 04/08/17  Yes Hayes-Raulerson, Alma DownsSheri Lynn, APRN   gabapentin (NEURONTIN) 100 MG PO Capsule Take 1 capsule by mouth daily as needed. 01/19/17  Yes Hayes-Raulerson, Alma DownsSheri Lynn, APRN   Hyoscyamine Sulfate (HYOSCYAMINE PO) by mouth.   Yes Information, Historical   ibuprofen (ADVIL,MOTRIN) 400 MG PO Tablet Take 1 tablet by mouth every 6 hours as needed for pain or fever. 03/22/17  Yes Kiel, Clayborne ArtistJohn Michael, DO   mupirocin (BACTROBAN) 2 % EX Ointment APPLY  OINTMENT TOPICALLY THREE TIMES DAILY TO  BREAST  SORES 01/20/17  Yes Hayes-Raulerson, Alma DownsSheri Lynn, APRN   nystatin (MYCOSTATIN) 100000 UNIT/GM EX Powder Apply daily as needed 11/08/16  Yes Hayes-Raulerson, Alma DownsSheri Lynn, APRN   tiZANidine (ZANAFLEX) 4 MG PO Tablet Take 1 tablet by mouth every 8 hours as needed. 04/08/17  Yes Waters, Tacy DuraLynn Michael Jr., DO

## 2017-04-22 NOTE — Progress Notes
tiZANidine (ZANAFLEX) 4 MG PO Tablet Take 1 tablet by mouth every 8 hours as needed. 04/08/17  Yes Hayes-Raulerson, Alma DownsSheri Lynn, APRN   triamcinolone (KENALOG) 0.1 % EX Cream  03/16/16  Yes Information, Historical   HYDROcodone-acetaminophen (NORCO) 5-325 MG PO Tablet Take 1 tablet by mouth every 8 hours as needed. 12/29/16 01/05/17  Delena ServeWaters, Lynn Michael Jr., DO           Review of Systems:  Constitutional: no unexplained fevers.  Eyes: No recent visual changes or discomfort.  ENT: hearing unchanged:  CV: denies chest pain, DOE, palpitations, leg swelling  Resp: No new cough or change in chronic cough: denies hemoptysis, sputum production, or wheezing.  GI: denies changes in appetite or bowel habits;   MS: no change in muscle strength; denies joint pain, swelling, or stiffness.  Skin: denies recent changes in hair, skin, or nails.   Neuro: denies seizures, syncope or memory changes;  Heme: No easy brusing or bleeding      Physical Exam:  Vitals:    04/19/17 1032   BP: 125/59   Pulse: 83   Resp: 16   Temp: 36.8 ?C (98.2 ?F)   Weight: 122.9 kg (270 lb 14.4 oz)   Height: 1.778 m (5\' 10" )     GENERAL: Well developed, well nourished, appears stated age,  EYES: Lids without erythema or lesion, conjunctivae pink, sclera white.  ENT: external ears symmetric without lesions or deformity,   Oropharynx without lesions, erythema or exudate.   MP 4  Tonsils 1  NECK: without thyromegaly, nodules or masses.   CV: RRR with normal S1/S2, no S3 or S4, no murmur:  no pedal edema.  RESPIRATORY: Non labored breathing, symmetric chest wall movement, No wheezes, rales or rhonchi, chest resonant.  ABD: soft, nondistended, with normoactive bowel sounds.   LYMPHATIC: no neck lymphadenopathy noted.  MUSCULOSKELETAL: Normal gait and station,   Digits/nails without cyanosis or clubbing.   NEUROLOGIC: Grossly Normal   FPsyche: oriented to time, person, place; normal speech and content; appropriate mood and affect; interactive and responsive; memory,

## 2017-04-22 NOTE — Progress Notes
Referring Physician: Denita LungHayes-Raulerson, Sheri *      HPI:  Monica Snyder is a  32 y.o. female who comes in today for a new patient evaluation of sleep paralysis. She is not working.    Her nocturnal respiratory symptoms are positive for snoring, gasping/choking and witnessed apneas.  These symptoms have been going on for years and have remained the same.Marland Kitchen.    Her sleep pattern is such that she is usually in bed by 11-midnight.  It takes her a long time to fall asleep.  Once asleep, the patient stays asleep.  If the patient awakens in the night, she the has no  difficulties falling back to sleep.  The patient practices poor sleep hygiene including screen time.  The patient has a usual wake up time of 10-11 am.  Upon awakening in the morning, the patient reports morning headaches.  She is is tired/groggy in the morning.    During the day, the patient has an ESS of Total score: 3.  She denies being sleepy during the day.  She does not nap during the day.    The patient's daytime symptoms do not interfere with her regular daytime activities.    The patient denies cataplexy,  violent dreams, dream enactment.  She notes that she has had sleep paralysis that last a few minutes that began several months ago without a clear precipitant.  No new medications.  She is noting that they occur randomly and are not associated with anything.  She will get them an average of 1-2 times/months.  No other associated sx, including hallucinations etc....    Has a hx of sleep walking as a child that resolved many yrs ago.  No other parasomnias noted now.    She notes that she has RLS like sx that occur an average of about one/time per week that prevent her from falling asleep.  She has known anemia and takes iron supplements when she remembers.    The patient's weight has increased.    The patient's ESS today is Total score: 3.    The patient  has a past medical history of Anemia; Arthritis; Frequent

## 2017-04-27 ENCOUNTER — Ambulatory Visit: Attending: Surgery of the Hand | Primary: Family Medicine

## 2017-04-27 DIAGNOSIS — N62 Hypertrophy of breast: Principal | ICD-10-CM

## 2017-04-27 DIAGNOSIS — G8929 Other chronic pain: Secondary | ICD-10-CM

## 2017-04-27 DIAGNOSIS — M1990 Unspecified osteoarthritis, unspecified site: Principal | ICD-10-CM

## 2017-04-27 DIAGNOSIS — D649 Anemia, unspecified: Secondary | ICD-10-CM

## 2017-04-27 DIAGNOSIS — M545 Low back pain: Secondary | ICD-10-CM

## 2017-04-27 DIAGNOSIS — G43909 Migraine, unspecified, not intractable, without status migrainosus: Secondary | ICD-10-CM

## 2017-04-27 DIAGNOSIS — R238 Other skin changes: Secondary | ICD-10-CM

## 2017-04-27 DIAGNOSIS — R51 Headache: Secondary | ICD-10-CM

## 2017-04-27 MED ORDER — HEPARIN SODIUM (PORCINE) 5000 UNIT/ML IJ SOLN
5000 [IU] | Freq: Once | SUBCUTANEOUS | Status: CN
Start: 2017-04-27 — End: ?

## 2017-04-27 MED ORDER — CEFAZOLIN SODIUM 1 G IJ SOLR
3 g | Freq: Once | INTRAVENOUS | Status: CN
Start: 2017-04-27 — End: ?

## 2017-04-27 NOTE — Progress Notes
Chief Complaint: New Patient (Breast Reduction)    HPI:   Monica Snyder is a 32 y.o. White female with large pendulous breasts, chronic back pain, neck pain and shoulder pain for more than 10 years . She has been large chested since  she was 14,but increased in size with pregnancy and weight gain. She currently wears a 50 L brassiere with wide shoulder straps.  She has had chronic intertrigo and deep shoulder indentions.  She has not seen a  physical therapist/chiropractor in the past with relief. She has tried to lose weight, and has had no change  in size of her breast; she has tried to lose weight via dietary restriction and exercise.   she uses Norco, Zanaflex, and Ibuprofen without complete relief.  She does not have osteoarthritis, fibromyalgia, orthopedic injury and chronic pain. Because she has been recalcitrant to conservative management, her primary has referred her for evaluation reduction mammoplasty.  She has mammo ordered and is in the process of seeing Pulmonary for sleep study and Neurology for chronic migraines.    Past Medical History:    Past Medical History:   Diagnosis Date   ? Anemia    ? Arthritis    ? Frequent headaches    ? Migraine        Past Surgical History:    History reviewed. No pertinent surgical history.    Allergies:    Allergies   Allergen Reactions   ? Cherry Other (See Comments)     Pt did not specify   ? Ginger Other (See Comments)     Pt did not specify   ? Naproxen Other (See Comments)     Pt did not specify   ? Prednisone Other (See Comments)     Pt did not specify       Current Meds:    Outpatient Encounter Prescriptions as of 04/27/2017   Medication Sig Dispense Refill   ? amitriptyline (ELAVIL) 50 MG PO Tablet Take 1 tablet by mouth nightly at bedtime. 30 tablet 1   ? ferrous sulfate 325 (65 Fe) MG PO Tablet Delayed Release Take 1 tablet by mouth 2 times daily. 60 tablet 3   ? ferrous sulfate 325 (65 Fe) MG PO Tablet Delayed Release Take 1 tablet

## 2017-04-27 NOTE — Progress Notes
?   mupirocin (BACTROBAN) 2 % EX Ointment APPLY  OINTMENT TOPICALLY THREE TIMES DAILY TO  BREAST  SORES 22 g 0   ? nystatin (MYCOSTATIN) 100000 UNIT/GM EX Powder Apply daily as needed 60 g 2   ? tiZANidine (ZANAFLEX) 4 MG PO Tablet Take 1 tablet by mouth every 8 hours as needed. 30 tablet 1   ? tiZANidine (ZANAFLEX) 4 MG PO Tablet Take 1 tablet by mouth every 8 hours as needed. 30 tablet 1   ? triamcinolone (KENALOG) 0.1 % EX Cream      ? HYDROcodone-acetaminophen (NORCO) 5-325 MG PO Tablet Take 1 tablet by mouth every 8 hours as needed. 21 tablet 0     No facility-administered encounter medications on file as of 04/27/2017.        Family History:  Family History   Problem Relation Age of Onset   ? Asthma Other    ? Diabetes Other    ? High Blood Pressure Other    ? Arthritis Other    ? Ovarian Cancer Other    ? Glaucoma Maternal Aunt        Social History:  Social History     Social History   ? Marital status: Single     Spouse name: N/A   ? Number of children: N/A   ? Years of education: N/A     Occupational History   ? Not on file.     Social History Main Topics   ? Smoking status: Former Smoker     Packs/day: 0.25     Years: 11.00     Types: Cigarettes     Start date: 2007     Quit date: 11/2016   ? Smokeless tobacco: Never Used   ? Alcohol use No   ? Drug use: No   ? Sexual activity: No     Other Topics Concern   ? Not on file     Social History Narrative   ? No narrative on file       Review of Systems:  No significant complaints on review of systems.    Constitutional: Neg - Fatigue, Fever, Chills, Weight change  Eyes:  Neg - Glasses, Contact lenses, Blurry vision   ENMT:  Neg - Ear disease, Frequent nose bleeds, Dry mouth, Sore throat   Cardiovascular:  Neg - Chest pain, Murmur, Palpitations   Respiratory:  Neg - SOB, Wheezing, Cough  Musculoskeletal:  Neg - Joint pain, Difficulty walking   Gastrointestinal:  Neg - Nausea, Vomiting, Abd pain, Diarrhea, Constipation

## 2017-04-27 NOTE — Progress Notes
ABDOMEN:  Soft, normal bowel sounds, no distention, no tenderness to palpation, without rebound or guarding.  EXTREMITIES:  Full range of motions  NEUROLOGICAL:  CN 2-12 grossly intact.    ASSESSMENT  Monica Snyder was seen today for new patient.    Diagnoses and all orders for this visit:    Indentation of skin due to clothing  -     Refer to Plastic Surgery    Large breasts  -     Refer to Plastic Surgery    Chronic midline low back pain without sciatica  -     Refer to Plastic Surgery        RECOMMENDATION:  RISKS:  The risks of the breast reduction were discussed, but are not limited too the following:  1.  Scarring.  2.  Bleeding.  3.  Infection.  4.  Deformity   5.  Numbness.  6.  Nipple sensation loss.  7.  Nipple loss.  8.  Possible need for nipple grafting.  9.  Inability to breastfeed.  10.  Lumpiness.  11.  Asymmetry.   12.  No guarantee of specific cup size.  13.  Most likely will need free nipple graft  The risks above were explained and her questions were answered.  She acknowledges that she still would like to proceed with the procedure.  Submit to insurance and schedule

## 2017-04-27 NOTE — Progress Notes
Hematologic:   Neg - Easy Bruising or Bleeding, Anemia   Integumentary: Neg - Itching, Rashes Lumps  Neurological:  Neg - Syncope, Seizures, Parasthesias  Endocrine: Neg - Polydipsia, Change in hair growth    Vital Signs:  Vitals Recorded in This Encounter       04/27/2017 1326             BP: 113/77    Pulse: 74    Pulse Source: Monitor    Pulse Quality: Normal    Resp: 16    Respiration Quality: Normal    Temp: 36.8 ?C (98.2 ?F)    Weight: 122.9 kg (271 lb)    Height: 1.778 m (5\' 10" )    BMI (Calculated): 38.97    Pain Score: Zero    Learning/Communication Barriers?: No    Had recent fall / Last 6 months?: No recent fall    Does patient have a fear of falling?: No          Chaperone for examination:  ***    Physical Exam  She is alert and oriented x3.    BREAST MEASUREMENTS:  1.  Nipple to sternal notch:  Right side: *** cm, left side:  *** cm.  2.  Nipple to inframammary fold:  Right side: *** cm, left side:  *** cm.  3.  Bust circumference *** inches.  4.  Chest circumference: *** inches.  5.  Nipple to nipple:  *** cm.  6.  Hemicircumference:  Right *** inches, left *** inches  7.  Amount to be removed from each breast:  ***grams.  8.  Midline to nipple: right:*** cm  left *** cm    Pictures were taken.    VITALS:  Within normal ranges, afebrile.  GENERAL:  She appears calm and is in no acute distress.  HEENT:  Normocephalic, atraumatic, conjunctivae pink.  NECK:  Supple, no lymphadenopathy.  CHEST:  Clear to auscultation bilaterally.  BREASTS:  Very large, long, wide bilaterally.  I do not appreciate any dominant mass bilaterally.  Moderate skin thickness, compliance, and elasticity.  Bilateral superior shoulder bra strap grooving evident.  Post-inflammatory hyperpigmentation evident at the upper abdominal skin, adjacent to the breasts.  CARDIOVASCULAR:  RRR, normal limits S1, S2.  BACK:  Normal.  ABDOMEN:  Soft, normal bowel sounds, no distention, no tenderness to palpation, without rebound or guarding.

## 2017-04-27 NOTE — Progress Notes
by mouth 2 times daily. 60 tablet 3   ? gabapentin (NEURONTIN) 100 MG PO Capsule Take 1 capsule by mouth daily as needed. 90 capsule 0   ? Hyoscyamine Sulfate (HYOSCYAMINE PO) by mouth.     ? ibuprofen (ADVIL,MOTRIN) 400 MG PO Tablet Take 1 tablet by mouth every 6 hours as needed for pain or fever. 90 tablet 1   ? mupirocin (BACTROBAN) 2 % EX Ointment APPLY  OINTMENT TOPICALLY THREE TIMES DAILY TO  BREAST  SORES 22 g 0   ? nystatin (MYCOSTATIN) 100000 UNIT/GM EX Powder Apply daily as needed 60 g 2   ? tiZANidine (ZANAFLEX) 4 MG PO Tablet Take 1 tablet by mouth every 8 hours as needed. 30 tablet 1   ? tiZANidine (ZANAFLEX) 4 MG PO Tablet Take 1 tablet by mouth every 8 hours as needed. 30 tablet 1   ? triamcinolone (KENALOG) 0.1 % EX Cream      ? HYDROcodone-acetaminophen (NORCO) 5-325 MG PO Tablet Take 1 tablet by mouth every 8 hours as needed. 21 tablet 0     No facility-administered encounter medications on file as of 04/27/2017.        Family History:    Family History   Problem Relation Age of Onset   ? Asthma Other    ? Diabetes Other    ? High Blood Pressure Other    ? Arthritis Other    ? Ovarian Cancer Other    ? Glaucoma Maternal Aunt        Social History:      Social History     Social History   ? Marital status: Single     Spouse name: N/A   ? Number of children: N/A   ? Years of education: N/A     Occupational History   ? Not on file.     Social History Main Topics   ? Smoking status: Former Smoker     Packs/day: 0.25     Years: 11.00     Types: Cigarettes     Start date: 2007     Quit date: 11/2016   ? Smokeless tobacco: Never Used   ? Alcohol use No   ? Drug use: No   ? Sexual activity: No     Other Topics Concern   ? Not on file     Social History Narrative   ? No narrative on file       Review of Systems:    Constitutional: Neg - Fatigue, Fever, Chills, Weight change  Eyes:  Neg - Glasses, Contact lenses, Blurry vision   ENMT:  Neg - Ear disease, Frequent nose bleeds, Dry mouth, Sore throat

## 2017-04-27 NOTE — Progress Notes
Chief Complaint:   Chief Complaint   Patient presents with   ? New Patient     Breast Reduction     HPI:    Ms. Marland Kitchen*** is a very pleasant *** yo female who presents with severe chronic upper back, bilateral shoulder, and lower neck pain as related to her abnormally large sized breasts.  She currently wears a bra size of *** and she has been large breasted since the age of ***.  She has lost #*** through diet & exercise but she has not noticed any improvement in the volume or weight of her breasts.  She takes ibuprofen and tylenol for the pain, with modest improvement.  She has been treated by a Chiropractor, without improvement.  She has tried wide shoulder straps on her bra, without improvement.    Past Medical History:  Past Medical History:   Diagnosis Date   ? Anemia    ? Arthritis    ? Frequent headaches    ? Migraine        Past Surgical History:  History reviewed. No pertinent surgical history.    Allergies:  Allergies   Allergen Reactions   ? Cherry Other (See Comments)     Pt did not specify   ? Ginger Other (See Comments)     Pt did not specify   ? Naproxen Other (See Comments)     Pt did not specify   ? Prednisone Other (See Comments)     Pt did not specify       Current Meds:  Outpatient Encounter Prescriptions as of 04/27/2017   Medication Sig Dispense Refill   ? amitriptyline (ELAVIL) 50 MG PO Tablet Take 1 tablet by mouth nightly at bedtime. 30 tablet 1   ? ferrous sulfate 325 (65 Fe) MG PO Tablet Delayed Release Take 1 tablet by mouth 2 times daily. 60 tablet 3   ? ferrous sulfate 325 (65 Fe) MG PO Tablet Delayed Release Take 1 tablet by mouth 2 times daily. 60 tablet 3   ? gabapentin (NEURONTIN) 100 MG PO Capsule Take 1 capsule by mouth daily as needed. 90 capsule 0   ? Hyoscyamine Sulfate (HYOSCYAMINE PO) by mouth.     ? ibuprofen (ADVIL,MOTRIN) 400 MG PO Tablet Take 1 tablet by mouth every 6 hours as needed for pain or fever. 90 tablet 1

## 2017-04-27 NOTE — Progress Notes
Cardiovascular:  Neg - Chest pain, Murmur, Palpitations   Respiratory:  Neg - SOB, Wheezing, Cough  Musculoskeletal: POS - Joint pain, Difficulty walking - back neck and shoulders  Gastrointestinal:  Neg - Nausea, Vomiting, Abd pain, Diarrhea, Constipation   Hematologic:   Neg - Easy Bruising or Bleeding, POS- Anemia  secondary heavy menses  Integumentary: Neg - Itching, Rashes Lumps  Neurological:  Neg - Syncope, Seizures, Parasthesias  POS- chronic migraines  Endocrine: Neg - Polydipsia, Change in hair growth    Vital Signs:    Vitals Recorded in This Encounter       04/27/2017 1326             BP: 113/77    Pulse: 74    Pulse Source: Monitor    Pulse Quality: Normal    Resp: 16    Respiration Quality: Normal    Temp: 36.8 ?C (98.2 ?F)    Weight: 122.9 kg (271 lb)    Height: 1.778 m (5\' 10" )    BMI (Calculated): 38.97    Pain Score: Zero    Learning/Communication Barriers?: No    Had recent fall / Last 6 months?: No recent fall    Does patient have a fear of falling?: No          Physical Exam:   Physical Exam  She is alert and oriented x3.  Age: 32 y.o. year old  Bra size:  50 L    BREAST MEASUREMENTS:  1.  Nipple to sternal notch:  Right side: 52 cm, left side:  51 cm.  2.  Nipple to inframammary fold:  Right side: 31 cm, left side:  26 cm.  3.  Bust circumference 57 inches.  4.  Chest circumference: Right side: 47 inches.  5.  Nipple to nipple:   NA cm.  6.  Hemicircumference:  Right 28 inches, left 26 inches  7. Amount to be removed from each breast 1662 grams.    VITALS:  Within normal ranges, afebrile.  GENERAL:  She appears calm and is in no acute distress.  HEENT:  Normocephalic, atraumatic, conjunctivae pink, PERRL, nasopharynx normal.  NECK:  Supple, no lymphadenopathy.  CHEST:  Clear to auscultation bilaterally.  BREASTS:  Class IVTosis. and No masses, no skin changes, no nipple discharge. moderate hyperpigmentation bilateral breast  CARDIOVASCULAR:  RRR, normal limits S1, S2.  BACK:  Normal.

## 2017-04-27 NOTE — Progress Notes
EXTREMITIES:  Full range of motions  NEUROLOGICAL:  CN 2-12 grossly intact.    ASSESSMENT  Symptomatic bilateral mammary hypertrophy    RECOMMENDATION  Bilateral therapeutic reduction mammaplasty with possible free nipple-areolar grafting.  I have reviewed the patient's history and examined the patient and agree with the above stated assessment and plan.  I have discussed in detail the indications, risk, benefits and alternatives of undergoing bilateral therapeutic reduction mammaplasty.  We have discussed the fact that this surgery may not partially nor completely alleviate her constitutional complaints of associated pain.  We have discussed the length and location of the planned surgical scars and have reviewed at length the risk of the development of hypertrophic and keloidal scars (which are permanent and can be not only visibly dysfiguring, but also chronically painful).  We have discussed the risks of nipple-areolar death, necessitating nipple-areolar reconstruction, permanent numbness of the nipple-areola as well as the breast skin.  We have discussed that the nipple-areolar complex may need to be grafted during the reduction procedure (and if so, the nipple-areolar complex will be permanently flat, numb, and with patchy pigmentation).  We have discussed the risks of infection, bleeding, fat necrosis, asymmetry, over-resection, under-resection, and permanent changes to her future mammograms.  We have discussed that this surgery greatly reduces one's ability to breast feed.  I do believe she understands these issues and she wishes to proceed.

## 2017-05-23 DIAGNOSIS — M545 Low back pain: Secondary | ICD-10-CM

## 2017-05-23 DIAGNOSIS — M549 Dorsalgia, unspecified: Secondary | ICD-10-CM

## 2017-05-23 DIAGNOSIS — Z79899 Other long term (current) drug therapy: Secondary | ICD-10-CM

## 2017-05-23 DIAGNOSIS — N62 Hypertrophy of breast: Principal | ICD-10-CM

## 2017-05-23 DIAGNOSIS — M25512 Pain in left shoulder: Secondary | ICD-10-CM

## 2017-05-23 DIAGNOSIS — M542 Cervicalgia: Secondary | ICD-10-CM

## 2017-05-23 DIAGNOSIS — D649 Anemia, unspecified: Secondary | ICD-10-CM

## 2017-05-23 DIAGNOSIS — Z87891 Personal history of nicotine dependence: Secondary | ICD-10-CM

## 2017-05-23 DIAGNOSIS — M25511 Pain in right shoulder: Secondary | ICD-10-CM

## 2017-05-26 ENCOUNTER — Encounter: Primary: Family Medicine

## 2017-05-30 ENCOUNTER — Encounter: Primary: Family Medicine

## 2017-05-31 ENCOUNTER — Inpatient Hospital Stay: Attending: Emergency Medicine | Primary: Family Medicine

## 2017-06-03 ENCOUNTER — Inpatient Hospital Stay: Admit: 2017-06-03 | Discharge: 2017-06-04 | Primary: Family Medicine

## 2017-06-03 DIAGNOSIS — R51 Headache: Secondary | ICD-10-CM

## 2017-06-03 DIAGNOSIS — N6459 Other signs and symptoms in breast: Secondary | ICD-10-CM

## 2017-06-03 DIAGNOSIS — N63 Unspecified lump in unspecified breast: Secondary | ICD-10-CM

## 2017-06-03 DIAGNOSIS — N644 Mastodynia: Principal | ICD-10-CM

## 2017-06-03 DIAGNOSIS — M1990 Unspecified osteoarthritis, unspecified site: Principal | ICD-10-CM

## 2017-06-03 DIAGNOSIS — D649 Anemia, unspecified: Secondary | ICD-10-CM

## 2017-06-03 DIAGNOSIS — G43909 Migraine, unspecified, not intractable, without status migrainosus: Secondary | ICD-10-CM

## 2017-06-07 ENCOUNTER — Ambulatory Visit: Admit: 2017-06-07 | Discharge: 2017-06-07 | Attending: Emergency Medicine | Primary: Family Medicine

## 2017-06-07 DIAGNOSIS — G43909 Migraine, unspecified, not intractable, without status migrainosus: Secondary | ICD-10-CM

## 2017-06-07 DIAGNOSIS — M1990 Unspecified osteoarthritis, unspecified site: Secondary | ICD-10-CM

## 2017-06-07 DIAGNOSIS — D649 Anemia, unspecified: Secondary | ICD-10-CM

## 2017-06-07 DIAGNOSIS — M25562 Pain in left knee: Principal | ICD-10-CM

## 2017-06-07 DIAGNOSIS — Z87891 Personal history of nicotine dependence: Secondary | ICD-10-CM

## 2017-06-07 DIAGNOSIS — Z79899 Other long term (current) drug therapy: Secondary | ICD-10-CM

## 2017-06-07 DIAGNOSIS — R51 Headache: Secondary | ICD-10-CM

## 2017-06-07 DIAGNOSIS — Z791 Long term (current) use of non-steroidal anti-inflammatories (NSAID): Secondary | ICD-10-CM

## 2017-06-07 MED ORDER — CAPSAICIN 0.025 % EX CREA
Freq: Two times a day (BID) | TOPICAL | 3 refills | Status: CP
Start: 2017-06-07 — End: ?

## 2017-06-07 MED ORDER — DICLOFENAC SODIUM 75 MG PO TBEC
75 mg | Freq: Two times a day (BID) | ORAL | 2 refills | Status: CP
Start: 2017-06-07 — End: ?

## 2017-06-14 DIAGNOSIS — L732 Hidradenitis suppurativa: Principal | ICD-10-CM

## 2017-06-14 DIAGNOSIS — B379 Candidiasis, unspecified: Secondary | ICD-10-CM

## 2017-06-16 MED ORDER — MUPIROCIN 2 % EX OINT
0 refills
Start: 2017-06-16 — End: ?

## 2017-06-29 MED ORDER — NYSTATIN 100000 UNIT/GM EX CREA
0 refills | Status: CP
Start: 2017-06-29 — End: ?

## 2017-06-30 MED ORDER — DICLOFENAC SODIUM 1 % TD GEL
1 g | Freq: Four times a day (QID) | TOPICAL | 3 refills | Status: CP
Start: 2017-06-30 — End: ?

## 2017-07-06 ENCOUNTER — Encounter: Attending: Family | Primary: Family Medicine

## 2017-07-07 ENCOUNTER — Ambulatory Visit: Attending: Family Medicine | Primary: Family Medicine

## 2017-07-07 DIAGNOSIS — G4700 Insomnia, unspecified: Secondary | ICD-10-CM

## 2017-07-07 DIAGNOSIS — E6609 Other obesity due to excess calories: Secondary | ICD-10-CM

## 2017-07-07 DIAGNOSIS — Z6838 Body mass index (BMI) 38.0-38.9, adult: Secondary | ICD-10-CM

## 2017-07-07 DIAGNOSIS — D649 Anemia, unspecified: Secondary | ICD-10-CM

## 2017-07-07 DIAGNOSIS — N760 Acute vaginitis: Secondary | ICD-10-CM

## 2017-07-07 DIAGNOSIS — B9689 Other specified bacterial agents as the cause of diseases classified elsewhere: Secondary | ICD-10-CM

## 2017-07-07 DIAGNOSIS — G43909 Migraine, unspecified, not intractable, without status migrainosus: Secondary | ICD-10-CM

## 2017-07-07 DIAGNOSIS — N62 Hypertrophy of breast: Principal | ICD-10-CM

## 2017-07-07 DIAGNOSIS — M1990 Unspecified osteoarthritis, unspecified site: Secondary | ICD-10-CM

## 2017-07-07 DIAGNOSIS — R51 Headache: Secondary | ICD-10-CM

## 2017-07-07 DIAGNOSIS — M545 Low back pain: Secondary | ICD-10-CM

## 2017-07-07 DIAGNOSIS — Z79899 Other long term (current) drug therapy: Secondary | ICD-10-CM

## 2017-07-07 DIAGNOSIS — G8929 Other chronic pain: Secondary | ICD-10-CM

## 2017-07-07 MED ORDER — TRAZODONE HCL 100 MG PO TABS
100 mg | Freq: Every evening | ORAL | 5 refills | Status: CP
Start: 2017-07-07 — End: ?

## 2017-07-07 MED ORDER — HYDROCODONE-ACETAMINOPHEN 5-325 MG PO TABS
1 | ORAL_TABLET | Freq: Three times a day (TID) | ORAL | 0 refills | Status: SS | PRN
Start: 2017-07-07 — End: 2017-08-08

## 2017-07-12 DIAGNOSIS — M549 Dorsalgia, unspecified: Secondary | ICD-10-CM

## 2017-07-12 DIAGNOSIS — M25512 Pain in left shoulder: Secondary | ICD-10-CM

## 2017-07-12 DIAGNOSIS — D649 Anemia, unspecified: Secondary | ICD-10-CM

## 2017-07-12 DIAGNOSIS — N62 Hypertrophy of breast: Principal | ICD-10-CM

## 2017-07-12 DIAGNOSIS — M542 Cervicalgia: Secondary | ICD-10-CM

## 2017-07-12 DIAGNOSIS — Z87891 Personal history of nicotine dependence: Secondary | ICD-10-CM

## 2017-07-12 DIAGNOSIS — Z79899 Other long term (current) drug therapy: Secondary | ICD-10-CM

## 2017-07-12 DIAGNOSIS — M25511 Pain in right shoulder: Secondary | ICD-10-CM

## 2017-07-13 ENCOUNTER — Ambulatory Visit: Attending: Surgery of the Hand | Primary: Family Medicine

## 2017-07-13 DIAGNOSIS — M1990 Unspecified osteoarthritis, unspecified site: Principal | ICD-10-CM

## 2017-07-13 DIAGNOSIS — D649 Anemia, unspecified: Secondary | ICD-10-CM

## 2017-07-13 DIAGNOSIS — R51 Headache: Secondary | ICD-10-CM

## 2017-07-13 DIAGNOSIS — Z01810 Encounter for preprocedural cardiovascular examination: Secondary | ICD-10-CM

## 2017-07-13 DIAGNOSIS — M25511 Pain in right shoulder: Secondary | ICD-10-CM

## 2017-07-13 DIAGNOSIS — G43909 Migraine, unspecified, not intractable, without status migrainosus: Secondary | ICD-10-CM

## 2017-07-13 DIAGNOSIS — N62 Hypertrophy of breast: Principal | ICD-10-CM

## 2017-07-13 DIAGNOSIS — Z87891 Personal history of nicotine dependence: Secondary | ICD-10-CM

## 2017-07-13 DIAGNOSIS — M542 Cervicalgia: Secondary | ICD-10-CM

## 2017-07-13 DIAGNOSIS — M549 Dorsalgia, unspecified: Secondary | ICD-10-CM

## 2017-07-13 DIAGNOSIS — M25512 Pain in left shoulder: Secondary | ICD-10-CM

## 2017-07-13 DIAGNOSIS — Z79899 Other long term (current) drug therapy: Secondary | ICD-10-CM

## 2017-07-13 MED ORDER — CEPHALEXIN 500 MG PO CAPS
500 mg | Freq: Three times a day (TID) | ORAL | 0 refills | Status: CP
Start: 2017-07-13 — End: 2017-08-05

## 2017-07-18 DIAGNOSIS — M25511 Pain in right shoulder: Secondary | ICD-10-CM

## 2017-07-18 DIAGNOSIS — M542 Cervicalgia: Secondary | ICD-10-CM

## 2017-07-18 DIAGNOSIS — N62 Hypertrophy of breast: Principal | ICD-10-CM

## 2017-07-18 DIAGNOSIS — Z87891 Personal history of nicotine dependence: Secondary | ICD-10-CM

## 2017-07-18 DIAGNOSIS — M549 Dorsalgia, unspecified: Secondary | ICD-10-CM

## 2017-07-18 DIAGNOSIS — Z79899 Other long term (current) drug therapy: Secondary | ICD-10-CM

## 2017-07-18 DIAGNOSIS — M25512 Pain in left shoulder: Secondary | ICD-10-CM

## 2017-07-18 DIAGNOSIS — D649 Anemia, unspecified: Secondary | ICD-10-CM

## 2017-07-27 ENCOUNTER — Encounter: Attending: Surgical | Primary: Family Medicine

## 2017-08-01 ENCOUNTER — Encounter: Attending: Family | Primary: Family Medicine

## 2017-08-05 ENCOUNTER — Ambulatory Visit: Attending: Family Medicine | Primary: Family Medicine

## 2017-08-05 DIAGNOSIS — R51 Headache: Secondary | ICD-10-CM

## 2017-08-05 DIAGNOSIS — J309 Allergic rhinitis, unspecified: Secondary | ICD-10-CM

## 2017-08-05 DIAGNOSIS — R739 Hyperglycemia, unspecified: Secondary | ICD-10-CM

## 2017-08-05 DIAGNOSIS — R5382 Chronic fatigue, unspecified: Secondary | ICD-10-CM

## 2017-08-05 DIAGNOSIS — G43909 Migraine, unspecified, not intractable, without status migrainosus: Secondary | ICD-10-CM

## 2017-08-05 DIAGNOSIS — Z79899 Other long term (current) drug therapy: Secondary | ICD-10-CM

## 2017-08-05 DIAGNOSIS — M1990 Unspecified osteoarthritis, unspecified site: Principal | ICD-10-CM

## 2017-08-05 DIAGNOSIS — D649 Anemia, unspecified: Secondary | ICD-10-CM

## 2017-08-05 DIAGNOSIS — R300 Dysuria: Secondary | ICD-10-CM

## 2017-08-05 DIAGNOSIS — Z6838 Body mass index (BMI) 38.0-38.9, adult: Secondary | ICD-10-CM

## 2017-08-05 DIAGNOSIS — H1013 Acute atopic conjunctivitis, bilateral: Secondary | ICD-10-CM

## 2017-08-05 DIAGNOSIS — E6609 Other obesity due to excess calories: Secondary | ICD-10-CM

## 2017-08-05 DIAGNOSIS — G43009 Migraine without aura, not intractable, without status migrainosus: Secondary | ICD-10-CM

## 2017-08-05 DIAGNOSIS — R0982 Postnasal drip: Principal | ICD-10-CM

## 2017-08-05 MED ORDER — OLOPATADINE HCL 0.1 % OP SOLN
1 [drp] | Freq: Two times a day (BID) | OPHTHALMIC | 1 refills | Status: CP
Start: 2017-08-05 — End: ?

## 2017-08-05 MED ORDER — LORATADINE 10 MG PO TABS
10 mg | Freq: Every day | ORAL | 5 refills | Status: CP
Start: 2017-08-05 — End: ?

## 2017-08-06 MED ORDER — GABAPENTIN 300 MG PO CAPS
600 mg | Freq: Once | ORAL | Status: CP
Start: 2017-08-06 — End: ?

## 2017-08-06 MED ORDER — ACETAMINOPHEN 500 MG PO TABS
1000 mg | Freq: Once | ORAL | Status: CP
Start: 2017-08-06 — End: ?

## 2017-08-08 ENCOUNTER — Inpatient Hospital Stay

## 2017-08-08 ENCOUNTER — Ambulatory Visit

## 2017-08-08 ENCOUNTER — Inpatient Hospital Stay: Admit: 2017-08-08 | Discharge: 2017-08-08 | Primary: Family Medicine

## 2017-08-08 DIAGNOSIS — M1990 Unspecified osteoarthritis, unspecified site: Principal | ICD-10-CM

## 2017-08-08 DIAGNOSIS — R51 Headache: Secondary | ICD-10-CM

## 2017-08-08 DIAGNOSIS — G43909 Migraine, unspecified, not intractable, without status migrainosus: Secondary | ICD-10-CM

## 2017-08-08 DIAGNOSIS — D649 Anemia, unspecified: Secondary | ICD-10-CM

## 2017-08-08 MED ORDER — MIDAZOLAM HCL 2 MG/2ML IJ SOLN
Status: DC | PRN
Start: 2017-08-08 — End: 2017-08-08

## 2017-08-08 MED ORDER — LACTATED RINGERS IV SOLN
500 mL | Freq: Once | INTRAVENOUS | Status: CP
Start: 2017-08-08 — End: ?

## 2017-08-08 MED ORDER — PROPOFOL 10 MG/ML IV EMUL CUSTOM SH
Status: DC | PRN
Start: 2017-08-08 — End: 2017-08-08

## 2017-08-08 MED ORDER — HEPARIN SODIUM (PORCINE) 5000 UNIT/ML IJ SOLN
5000 [IU] | Freq: Once | SUBCUTANEOUS | Status: CP
Start: 2017-08-08 — End: ?

## 2017-08-08 MED ORDER — FENTANYL CITRATE INJ 50 MCG/ML CUSTOM AMP/VIAL
Status: DC | PRN
Start: 2017-08-08 — End: 2017-08-08

## 2017-08-08 MED ORDER — LIDOCAINE HCL (PF) 1 % IJ SOLN SH
INTRAVENOUS | Status: DC | PRN
Start: 2017-08-08 — End: 2017-08-08

## 2017-08-08 MED ORDER — OXYCODONE HCL 5 MG PO TABS
10 mg | ORAL | Status: DC | PRN
Start: 2017-08-08 — End: 2017-08-08

## 2017-08-08 MED ORDER — ROPIVACAINE HCL 5 MG/ML IJ SOLN
Status: DC | PRN
Start: 2017-08-08 — End: 2017-08-08

## 2017-08-08 MED ORDER — HALOPERIDOL LACTATE 5 MG/ML IJ SOLN
.5 mg | INTRAVENOUS | Status: DC | PRN
Start: 2017-08-08 — End: 2017-08-08

## 2017-08-08 MED ORDER — HYDROMORPHONE HCL 1 MG/ML IJ SOLN
.5 mg | INTRAVENOUS | Status: DC | PRN
Start: 2017-08-08 — End: 2017-08-08

## 2017-08-08 MED ORDER — MUPIROCIN 2 % EX OINT
Status: DC | PRN
Start: 2017-08-08 — End: 2017-08-08

## 2017-08-08 MED ORDER — LABETALOL HCL 5 MG/ML IV SOLN SH
10 mg | INTRAVENOUS | Status: DC | PRN
Start: 2017-08-08 — End: 2017-08-08

## 2017-08-08 MED ORDER — ONDANSETRON HCL 4 MG/2ML IJ SOLN
Status: DC | PRN
Start: 2017-08-08 — End: 2017-08-08

## 2017-08-08 MED ORDER — BACITRACIN 50000 UNITS IM SOLR
Status: DC | PRN
Start: 2017-08-08 — End: 2017-08-08

## 2017-08-08 MED ORDER — GLYCOPYRROLATE 0.4 MG/2ML IJ SOLN
Status: DC | PRN
Start: 2017-08-08 — End: 2017-08-08

## 2017-08-08 MED ORDER — HYDROMORPHONE HCL 2 MG/ML IJ SOLN JX
Status: DC | PRN
Start: 2017-08-08 — End: 2017-08-08

## 2017-08-08 MED ORDER — LACTATED RINGERS IV SOLN
Status: DC | PRN
Start: 2017-08-08 — End: 2017-08-08

## 2017-08-08 MED ORDER — CEFAZOLIN SODIUM 1 G IJ SOLR
3 g | Freq: Once | INTRAVENOUS | Status: CP
Start: 2017-08-08 — End: ?

## 2017-08-08 MED ORDER — INDOCYANINE GREEN 25 MG IV SOLR
Status: DC | PRN
Start: 2017-08-08 — End: 2017-08-08

## 2017-08-08 MED ORDER — NALOXONE HCL 0.4 MG/ML IJ SOLN
.4 mg | INTRAVENOUS | Status: DC | PRN
Start: 2017-08-08 — End: 2017-08-08

## 2017-08-08 MED ORDER — DEXAMETHASONE SODIUM PHOSPHATE 4 MG/ML CUSTOM COMPONENT JX
Status: DC | PRN
Start: 2017-08-08 — End: 2017-08-08

## 2017-08-08 MED ORDER — NALBUPHINE HCL 10 MG/ML IJ SOLN
2.5 mg | INTRAVENOUS | Status: DC | PRN
Start: 2017-08-08 — End: 2017-08-08

## 2017-08-08 MED ORDER — ROCURONIUM BROMIDE 50 MG/5ML IV SOSY
Status: DC | PRN
Start: 2017-08-08 — End: 2017-08-08

## 2017-08-08 MED ORDER — PROMETHAZINE HCL 25 MG/ML IJ SOLN
6.25 mg | INTRAVENOUS | Status: DC | PRN
Start: 2017-08-08 — End: 2017-08-08

## 2017-08-08 MED ORDER — KETAMINE HCL 10 MG/ML IJ SOLN
Status: DC | PRN
Start: 2017-08-08 — End: 2017-08-08

## 2017-08-08 MED ORDER — NEOSTIGMINE METHYLSULFATE 5 MG/5ML IV SOSY
Status: DC | PRN
Start: 2017-08-08 — End: 2017-08-08

## 2017-08-09 DIAGNOSIS — M1990 Unspecified osteoarthritis, unspecified site: Principal | ICD-10-CM

## 2017-08-09 DIAGNOSIS — G43909 Migraine, unspecified, not intractable, without status migrainosus: Secondary | ICD-10-CM

## 2017-08-09 DIAGNOSIS — D649 Anemia, unspecified: Secondary | ICD-10-CM

## 2017-08-09 DIAGNOSIS — R51 Headache: Secondary | ICD-10-CM

## 2017-08-17 ENCOUNTER — Ambulatory Visit: Attending: Surgery of the Hand | Primary: Family Medicine

## 2017-08-17 ENCOUNTER — Encounter: Primary: Family Medicine

## 2017-08-17 DIAGNOSIS — M1990 Unspecified osteoarthritis, unspecified site: Principal | ICD-10-CM

## 2017-08-17 DIAGNOSIS — G43909 Migraine, unspecified, not intractable, without status migrainosus: Secondary | ICD-10-CM

## 2017-08-17 DIAGNOSIS — Z9889 Other specified postprocedural states: Principal | ICD-10-CM

## 2017-08-17 DIAGNOSIS — R51 Headache: Secondary | ICD-10-CM

## 2017-08-17 DIAGNOSIS — D649 Anemia, unspecified: Secondary | ICD-10-CM

## 2017-08-17 MED ORDER — TRIAMCINOLONE ACETONIDE 0.1 % EX CREA
1 refills | Status: CP
Start: 2017-08-17 — End: ?

## 2017-08-24 ENCOUNTER — Encounter: Attending: Surgery of the Hand | Primary: Family Medicine

## 2017-08-31 ENCOUNTER — Ambulatory Visit: Attending: Surgical | Primary: Family Medicine

## 2017-08-31 DIAGNOSIS — Z9889 Other specified postprocedural states: Principal | ICD-10-CM

## 2017-08-31 DIAGNOSIS — G43909 Migraine, unspecified, not intractable, without status migrainosus: Secondary | ICD-10-CM

## 2017-08-31 DIAGNOSIS — M1990 Unspecified osteoarthritis, unspecified site: Principal | ICD-10-CM

## 2017-08-31 DIAGNOSIS — R51 Headache: Secondary | ICD-10-CM

## 2017-08-31 DIAGNOSIS — D649 Anemia, unspecified: Secondary | ICD-10-CM

## 2017-09-13 ENCOUNTER — Encounter: Primary: Family Medicine

## 2017-09-21 ENCOUNTER — Encounter: Attending: Surgery of the Hand | Primary: Family Medicine

## 2017-10-04 ENCOUNTER — Inpatient Hospital Stay: Primary: Family Medicine

## 2017-10-04 ENCOUNTER — Encounter: Attending: Emergency Medicine | Primary: Family Medicine

## 2018-09-15 IMAGING — CT CT HEAD WO/W CM
1 of 2 series · 13 of 30 positions shown, 17 images · IV contrast (iopamidol)
Comparison: None.

CLINICAL DATA: Migraines for the past several days.

EXAM:
CT HEAD WITHOUT AND WITH CONTRAST
TECHNIQUE: Contiguous axial images were obtained from the base of the skull
through the vertex without and with intravenous contrast
CONTRAST:  75mL 0OIF75-ECC IOPAMIDOL (0OIF75-ECC) INJECTION 61%

[Series 2: head wo · axial · 0.39mm/px · z∈[-38,+82]mm · 13 of 28 slices shown, 17 images]
[im 2/28  brain]
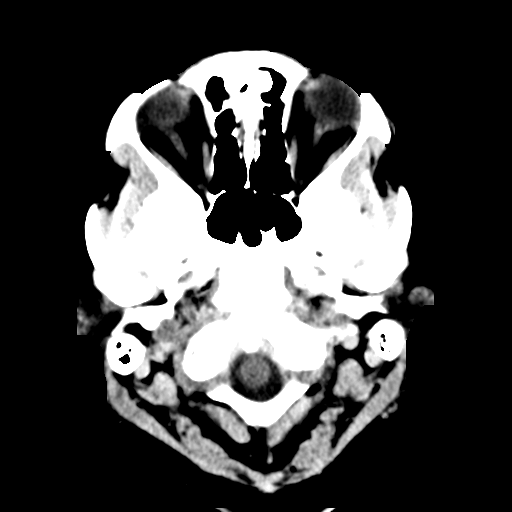
[im 2/28  bone]
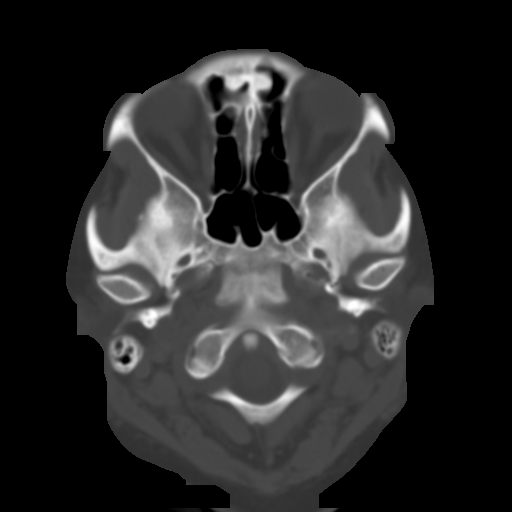
[im 4/28  brain]
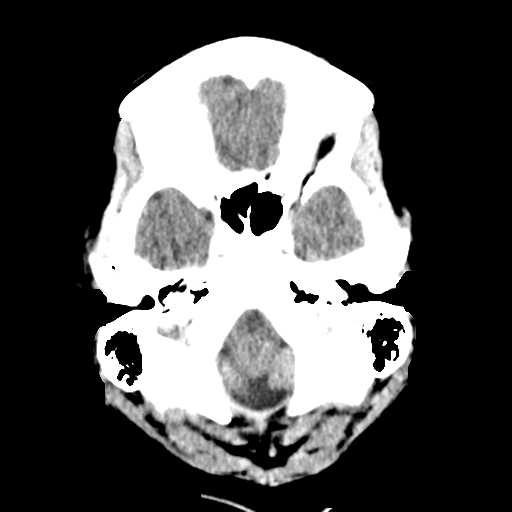
[im 6/28  brain]
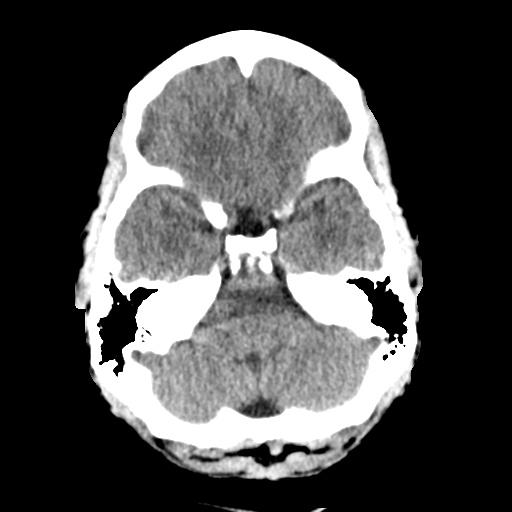
[im 8/28  brain]
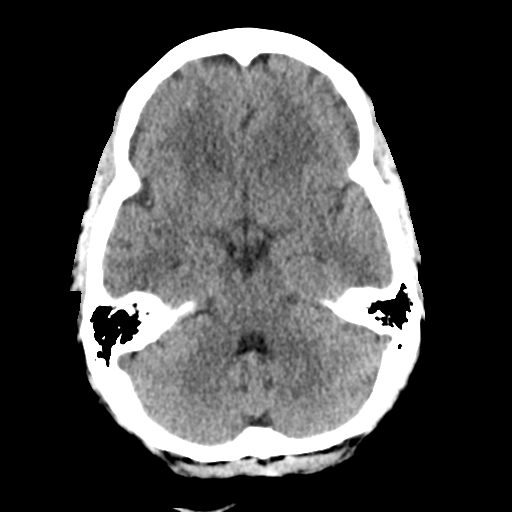
[im 10/28  brain]
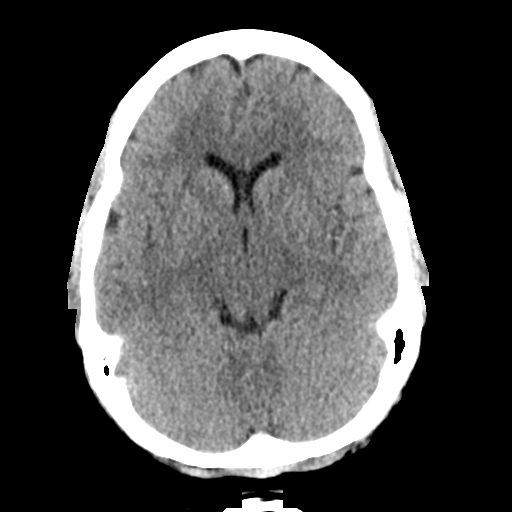
[im 10/28  bone]
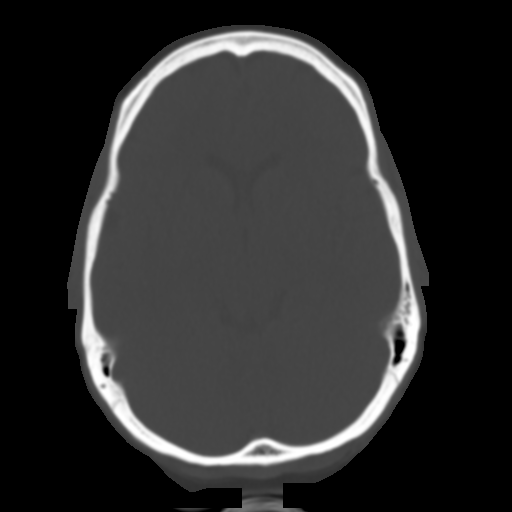
[im 12/28  brain]
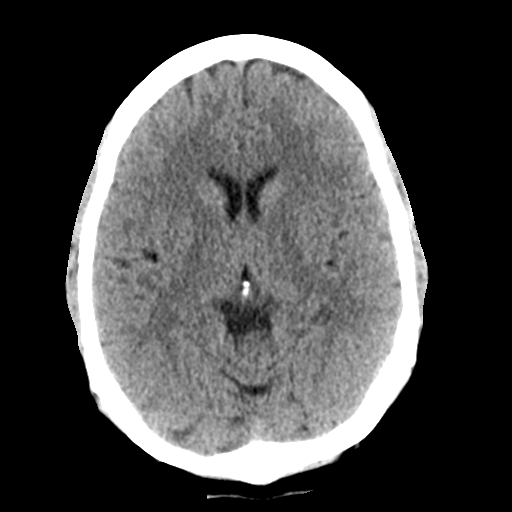
[im 14/28  brain]
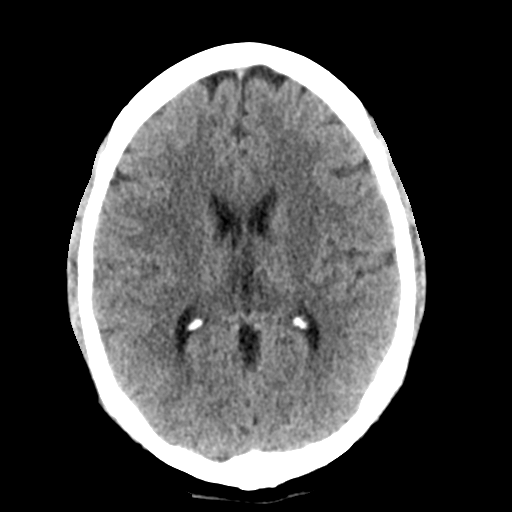
[im 16/28  brain]
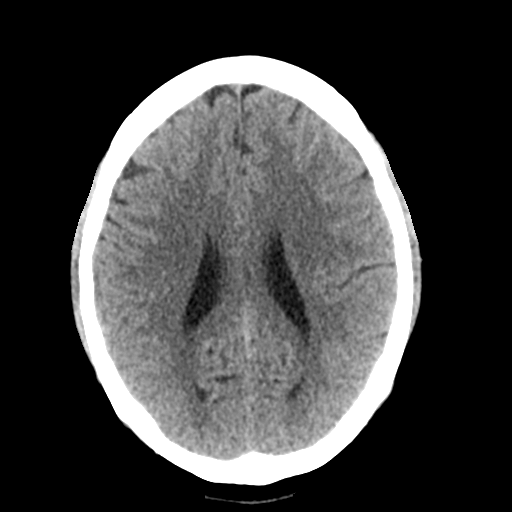
[im 18/28  brain]
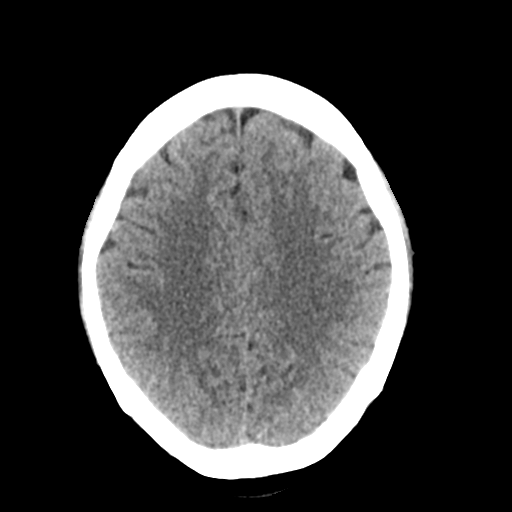
[im 18/28  bone]
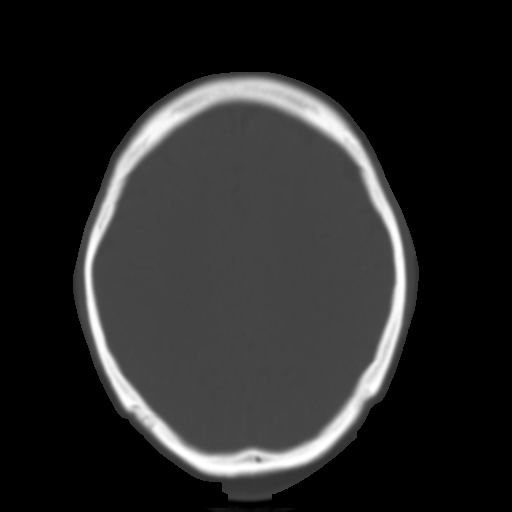
[im 20/28  brain]
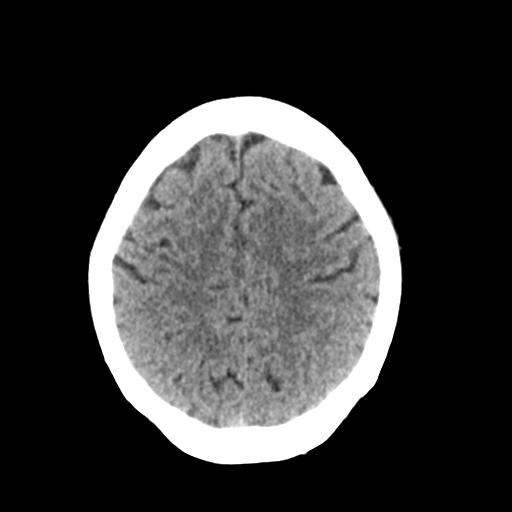
[im 22/28  brain]
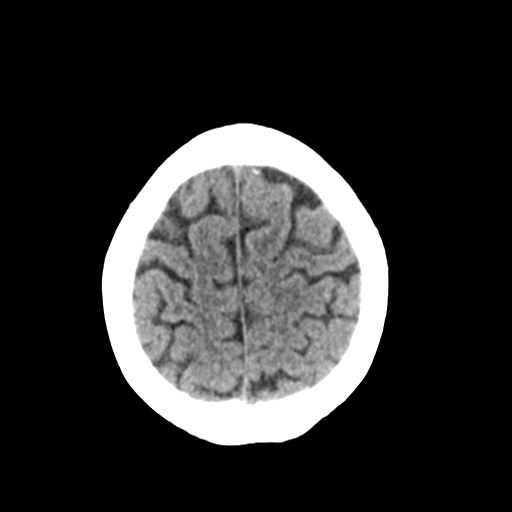
[im 24/28  brain]
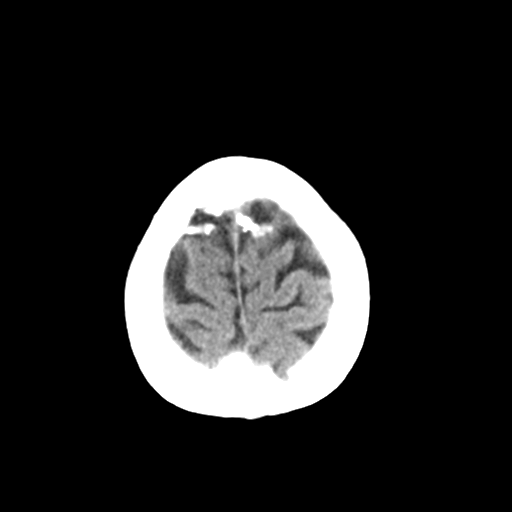
[im 26/28  brain]
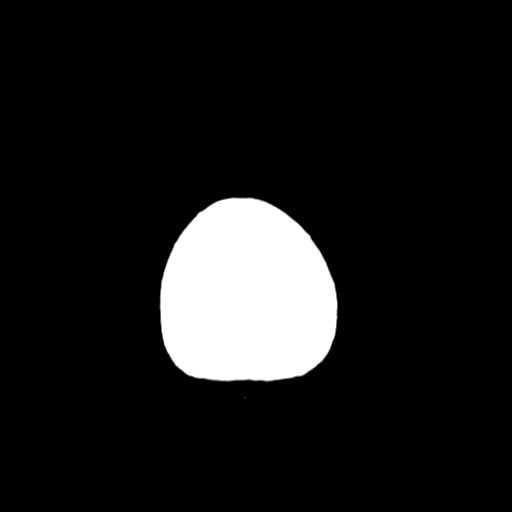
[im 26/28  bone]
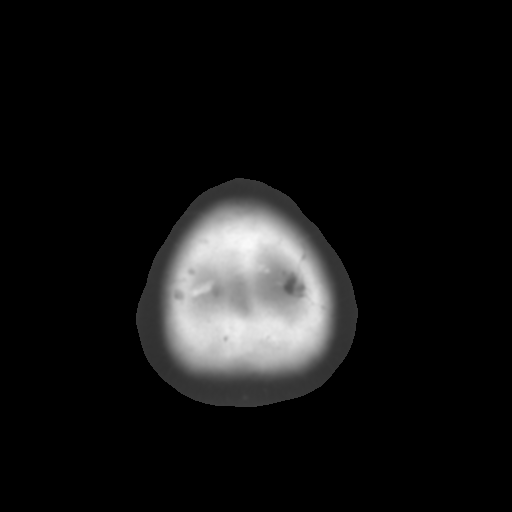

[13 of 30 positions shown; findings below may reference images not displayed]

FINDINGS: Brain: No evidence of acute infarction, hemorrhage, hydrocephalus,
extra-axial collection or mass lesion/mass effect. Normal cerebral
volume. No white matter disease. Post infusion, no abnormal
enhancement.

Vascular: No hyperdense vessel or unexpected calcification. Visible
vessels are patent.

Skull: Normal. Negative for fracture or focal lesion.

Sinuses/Orbits: No acute finding.

Other: None.
IMPRESSION: Negative exam.
